# Patient Record
Sex: Male | Born: 1975 | Race: White | Hispanic: No | Marital: Married | Smoking: Former smoker
Health system: Southern US, Community
[De-identification: ages and names within clinical notes are randomized; demographics above are authoritative.]

## PROBLEM LIST (undated history)

## (undated) DIAGNOSIS — D496 Neoplasm of unspecified behavior of brain: Secondary | ICD-10-CM

## (undated) DIAGNOSIS — M539 Dorsopathy, unspecified: Secondary | ICD-10-CM

## (undated) HISTORY — PX: SPINE SURGERY: SHX786

## (undated) HISTORY — PX: BRAIN SURGERY: SHX531

---

## 2014-02-23 ENCOUNTER — Other Ambulatory Visit (HOSPITAL_COMMUNITY): Payer: Self-pay | Admitting: Oncology

## 2014-02-23 DIAGNOSIS — D496 Neoplasm of unspecified behavior of brain: Secondary | ICD-10-CM

## 2014-02-23 DIAGNOSIS — C799 Secondary malignant neoplasm of unspecified site: Secondary | ICD-10-CM

## 2014-03-17 ENCOUNTER — Ambulatory Visit (HOSPITAL_COMMUNITY)
Admission: RE | Admit: 2014-03-17 | Discharge: 2014-03-17 | Disposition: A | Discharge status: Home / Self Care | Payer: BC Managed Care – PPO | Source: Ambulatory Visit | Attending: Oncology | Admitting: Oncology

## 2014-03-17 ENCOUNTER — Other Ambulatory Visit (HOSPITAL_COMMUNITY): Payer: Self-pay | Admitting: Oncology

## 2014-03-17 DIAGNOSIS — Z982 Presence of cerebrospinal fluid drainage device: Secondary | ICD-10-CM

## 2014-03-17 DIAGNOSIS — C799 Secondary malignant neoplasm of unspecified site: Secondary | ICD-10-CM

## 2014-03-17 DIAGNOSIS — C719 Malignant neoplasm of brain, unspecified: Secondary | ICD-10-CM | POA: Insufficient documentation

## 2014-03-17 DIAGNOSIS — R930 Abnormal findings on diagnostic imaging of skull and head, not elsewhere classified: Secondary | ICD-10-CM | POA: Insufficient documentation

## 2014-03-17 DIAGNOSIS — G9389 Other specified disorders of brain: Secondary | ICD-10-CM | POA: Insufficient documentation

## 2014-03-17 DIAGNOSIS — D496 Neoplasm of unspecified behavior of brain: Secondary | ICD-10-CM

## 2014-03-17 DIAGNOSIS — Z981 Arthrodesis status: Secondary | ICD-10-CM | POA: Insufficient documentation

## 2014-03-17 MED ORDER — GADOBENATE DIMEGLUMINE 529 MG/ML IV SOLN
15.0000 mL | Freq: Once | INTRAVENOUS | Status: AC | PRN
  Administered 2014-03-17: 15 mL via INTRAVENOUS

## 2014-12-03 ENCOUNTER — Encounter: Payer: Self-pay | Admitting: Physical Therapy

## 2014-12-03 ENCOUNTER — Ambulatory Visit: Payer: Medicare Other | Attending: Oncology | Admitting: Physical Therapy

## 2014-12-03 DIAGNOSIS — R269 Unspecified abnormalities of gait and mobility: Secondary | ICD-10-CM | POA: Insufficient documentation

## 2014-12-03 DIAGNOSIS — R279 Unspecified lack of coordination: Secondary | ICD-10-CM | POA: Diagnosis not present
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
## 2014-12-03 NOTE — Patient Instructions (Signed)
Instructed in forward, back and side kicks and marching in place for balance HEP

## 2014-12-14 ENCOUNTER — Encounter: Payer: Self-pay | Admitting: Physical Therapy

## 2014-12-14 ENCOUNTER — Ambulatory Visit: Payer: Medicare Other | Attending: Oncology | Admitting: Physical Therapy

## 2014-12-14 DIAGNOSIS — R279 Unspecified lack of coordination: Secondary | ICD-10-CM | POA: Insufficient documentation

## 2014-12-14 DIAGNOSIS — R269 Unspecified abnormalities of gait and mobility: Secondary | ICD-10-CM | POA: Diagnosis not present

## 2014-12-14 NOTE — Therapy (Signed)
[facility] [address] [place], [place], [address] Phone: [phone]   Fax:  [phone]  Physical Therapy Treatment  Patient Details  Name: Derek Day MRN: [id number] Date of Birth: Mar 21, 1976 Referring Provider:  Ardelle Anton, MD  Encounter Date: 12/14/2014      PT End of Session - 12/14/14 1305    Visit Number 2  G2   Number of Visits 17   Date for PT Re-Evaluation 01/02/15   Authorization Type BCBS   Authorization - Visit Number 30   PT Start Time 0930   PT Stop Time 1025   PT Time Calculation (min) 55 min      History reviewed. No pertinent past medical history.  History reviewed. No pertinent past surgical history.  There were no vitals taken for this visit.  Visit Diagnosis:  Abnormality of gait  Lack of coordination      Subjective Assessment - 12/14/14 1229    Symptoms Pt. states he tried the balance exercises given to him last time (at eval) but he has to hold onto the counter and has trouble doing the side kicks   Patient Stated Goals improve strength and balance   Currently in Pain? Yes   Pain Score 2    Pain Location Back   Pain Orientation Mid   Pain Descriptors / Indicators Tightness   Pain Type Chronic pain   Pain Onset More than a month ago   Pain Frequency Constant                    [facility] Adult PT Treatment/Exercise - 12/14/14 1232    Transfers   Transfers Sit to Stand  10 reps with use of RW to assist with balance upon standing   Lumbar Exercises: Standing   Heel Raises 10 reps   Knee/Hip Exercises: Supine   Bridges 10 reps  L 1/2 bridge 10 reps   Straight Leg Raises 10 reps  RLE and LLE   Knee/Hip Exercises: Sidelying   Hip ABduction 10 reps   Clams 10 reps   Knee/Hip Exercises: Prone   Hamstring Curl 10 reps  RLE and LLE; green band used for resistance for LLE     TherEx:  Heel slides RLE and LLE with extension x 10 reps each leg;  Scifit level 1.5  X 10" (no charge as unsupervised) R and L hip abduction with green theraband x 10 reps each in hooklying position  Neuro Re-ed: standing forward kicks and stepping up/back; lateral kicks and stepping out/in x 10 reps each with UE support  Prn:  Marching in place with UE support         PT Education - 12/14/14 1302    Education provided Yes   Education Details added strengthening exercises to HEP and sit to stand   Methods Handout;Explanation;Demonstration   Comprehension Verbalized understanding          PT Short Term Goals - 12/03/14 2019    PT SHORT TERM GOAL #1   Title Incr. Berg balance test score to >/=31/56 to decr. fall risk   Baseline score 26/56   target date 01-02-15   Time 4   Period Weeks   Status New   PT SHORT TERM GOAL #2   Title Improve TUG score to </= 24 secs with RW with SBA   Baseline 28.72 with RW   Target date 01-02-15   Time 4   Period Weeks   Status New  PT SHORT TERM GOAL #3   Title Increase gait velocity to >/= 2.9 ft/sec with RW with SBA   Baseline 2.59 ft/sec with RW     target date 01-02-15   Time 4   Period Weeks   Status New   PT SHORT TERM GOAL #4   Title Independent in HEP for balance and strengthening exercises   Baseline date 01-02-15   Time 4   Period Weeks   Status New           PT Long Term Goals - 12/03/14 2023    PT LONG TERM GOAL #1   Title Increase Berg balance test score to >/= 36/56 to decr. fall risk   Baseline 02-01-15     26/56 initial score on 12-03-14   Time 8   Period Weeks   Status New   PT LONG TERM GOAL #2   Title Improve TUG score to </= 19 secs with RW   Baseline  target date  02-01-15               28.72 secs with RW on 12-03-14   Time 8   Period Weeks   Status New   PT LONG TERM GOAL #3   Title Incr. gait velocity to >/= 2.2 ft/sec with RW with SBA   Baseline target date 02-01-15   2.59 ft/sec on 12-03-14   Time 8   Period Weeks   Status New   PT LONG TERM GOAL #4   Title Report  at least 50% improvement in balance and strength in bil. LE's   Baseline Target date 02-01-15   Time 8   Period Weeks   Status New               Plan - 12/14/14 1307    Clinical Impression Statement Pt. 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 requires frequent rest breaks   Pt will benefit from skilled therapeutic intervention in order to improve on the following deficits Abnormal gait;Decreased coordination;Difficulty walking;Decreased endurance;Decreased balance;Decreased mobility;Decreased strength;Pain;Impaired vision/preception   Rehab Potential Fair   PT Frequency 2x / week   PT Duration 8 weeks   PT Treatment/Interventions Therapeutic activities;Patient/family education;Therapeutic exercise;Gait training;Balance training;Stair training;Neuromuscular re-education;Functional mobility training   PT Next Visit Plan check balance HEP initiated today; cont. LLE strengthening and gait training/core stabilization   PT Home Exercise Plan balance and LLE strengthening   Consulted and Agree with Plan of Care Patient        Problem List There are no active problems to display for this patient.   KXFGHW, EXHBZ JIRCVEL, PT 12/14/2014, 1:13 PM  [facility] [address] [place], [place], [address] Phone: [phone]   Fax:  [phone]

## 2014-12-14 NOTE — Patient Instructions (Signed)
Strengthening: Straight Leg Raise (Phase 2)   Resting on forearms, tighten muscles on front of left thigh, then lift leg ____ inches from surface, keeping knee locked. Repeat ____ times per set. Do ____ sets per session. Do ____ sessions per day.  http://orth.exer.us/616   Copyright  VHI. All rights reserved.  Abduction / Adduction   Feet hip width apart, spread thighs out, then bring thighs together. Repeat ___ times each direction. Do ___ sessions per day. Do with ______ colored band around thighs. Note: If possible, place feet on floor.  Copyright  VHI. All rights reserved.  Knee Flexion: Resisted (Standing)   With support, ____ pound weight around right ankle, slowly bend knee up. Return slowly.  Repeat ____ times per set. Do ____ sets per session. Do ____ sessions per day.  http://orth.exer.us/740   Copyright  VHI. All rights reserved.  Abduction   Lift leg up toward ceiling. Return. Use ____ lbs on ankle. Repeat ____ times each leg. Do ____ sessions per day.  http://gt2.exer.us/385   Copyright  VHI. All rights reserved.  Abduction   Lift leg up toward ceiling. Return. Use ____ lbs on ankle. Repeat ____ times each leg. Do ____ sessions per day.  http://gt2.exer.us/385   Copyright  VHI. All rights reserved.  Bridging   Slowly raise buttocks from floor, keeping stomach tight. Repeat ____ times per set. Do ____ sets per session. Do ____ sessions per day.  http://orth.exer.us/1096   Copyright  VHI. All rights reserved.  Bridging   Slowly raise buttocks from floor, keeping stomach tight. Repeat ____ times per set. Do ____ sets per session. Do ____ sessions per day.  http://orth.exer.us/1096   Copyright  VHI. All rights reserved.  Functional Quadriceps: Sit to Stand   Sit on edge of chair, feet flat on floor. Stand upright, extending knees fully. Repeat ____ times per set. Do ____ sets per session. Do ____ sessions per  day.  http://orth.exer.us/734   Copyright  VHI. All rights reserved.  Functional Quadriceps: Sit to Stand   Sit on edge of chair, feet flat on floor. Stand upright, extending knees fully. Repeat ____ times per set. Do ____ sets per session. Do ____ sessions per day.  http://orth.exer.us/734   Copyright  VHI. All rights reserved.

## 2014-12-17 ENCOUNTER — Encounter: Payer: Self-pay | Admitting: Physical Therapy

## 2014-12-17 ENCOUNTER — Ambulatory Visit: Payer: Medicare Other | Admitting: Physical Therapy

## 2014-12-17 DIAGNOSIS — R269 Unspecified abnormalities of gait and mobility: Secondary | ICD-10-CM | POA: Diagnosis not present

## 2014-12-17 DIAGNOSIS — R279 Unspecified lack of coordination: Secondary | ICD-10-CM

## 2014-12-17 NOTE — Therapy (Signed)
[facility] [address] [place], [place], [address] Phone: [phone]   Fax:  [phone]  Physical Therapy Treatment  Patient Details  Name: Derek Day MRN: [id number] Date of Birth: Dec 20, 1975 Referring Provider:  Ardelle Anton, MD  Encounter Date: 12/17/2014      PT End of Session - 12/17/14 1237    Visit Number 3   Number of Visits 17   Date for PT Re-Evaluation 01/02/15   Authorization Type BCBS   Authorization - Visit Number 3   Authorization - Number of Visits 30   PT Start Time 0830   PT Stop Time 0925   PT Time Calculation (min) 55 min   Equipment Utilized During Treatment Gait belt      History reviewed. No pertinent past medical history.  History reviewed. No pertinent past surgical history.  There were no vitals taken for this visit.  Visit Diagnosis:  Abnormality of gait  Lack of coordination      Subjective Assessment - 12/17/14 1228    Symptoms Pt. states he was really tired after therapy on Monday; had to sit and wait a little while before driving home   Patient Stated Goals improve strength and balance   Currently in Pain? No/denies                    [facility] Adult PT Treatment/Exercise - 12/17/14 1235    Transfers   Transfers Sit to Stand  10 reps with use of RW to assist with balance upon standing   Knee/Hip Exercises: Aerobic   Stationary Bike Scifit level 1.6 x 5"   Knee/Hip Exercises: Standing   Heel Raises 10 reps      TherEx:  Bridging with ball between knees x 10 reps; 1/2 bridging R and LLE x 10 reps; hip abduction with green theraband x 10 reps in hooklying position:  Hip abduction with external rotation x 10 reps each leg; bil. Knee to chest With extension with CGA for control;  SLR x 10 reps each; R and LLE hip extension control off side of mat x 10 reps each with Min assist for control;  In hooklying - hip flexion with green band for resistance  x 10 reps each  NeuroRe-ed:  Sit to stand without UE support x 10 reps with CGA to min assist; standing unsupported with head turns With min assist - pt. c/o some vertigo with head turns Quadriped - lifting RUE and LUE , then RLE and LLE with min to mod assist to maintain balance in quadriped position            PT Short Term Goals - 12/03/14 2019    PT SHORT TERM GOAL #1   Title Incr. Berg balance test score to >/=31/56 to decr. fall risk   Baseline score 26/56   target date 01-02-15   Time 4   Period Weeks   Status New   PT SHORT TERM GOAL #2   Title Improve TUG score to </= 24 secs with RW with SBA   Baseline 28.72 with RW   Target date 01-02-15   Time 4   Period Weeks   Status New   PT SHORT TERM GOAL #3   Title Increase gait velocity to >/= 2.9 ft/sec with RW with SBA   Baseline 2.59 ft/sec with RW     target date 01-02-15   Time 4   Period Weeks   Status New   PT SHORT  TERM GOAL #4   Title Independent in HEP for balance and strengthening exercises   Baseline date 01-02-15   Time 4   Period Weeks   Status New           PT Long Term Goals - 12/03/14 2023    PT LONG TERM GOAL #1   Title Increase Berg balance test score to >/= 36/56 to decr. fall risk   Baseline 02-01-15     26/56 initial score on 12-03-14   Time 8   Period Weeks   Status New   PT LONG TERM GOAL #2   Title Improve TUG score to </= 19 secs with RW   Baseline  target date  02-01-15               28.72 secs with RW on 12-03-14   Time 8   Period Weeks   Status New   PT LONG TERM GOAL #3   Title Incr. gait velocity to >/= 2.2 ft/sec with RW with SBA   Baseline target date 02-01-15   2.59 ft/sec on 12-03-14   Time 8   Period Weeks   Status New   PT LONG TERM GOAL #4   Title Report at least 50% improvement in balance and strength in bil. LE's   Baseline Target date 02-01-15   Time 8   Period Weeks   Status New               Plan - 12/17/14 1238    Clinical Impression Statement Pt. has  significantly decreased core stabilization and decr. trunk musc. strength - needs min to mod assist to stabilize trunk during LE exercises   Pt will benefit from skilled therapeutic intervention in order to improve on the following deficits Abnormal gait;Decreased coordination;Difficulty walking;Decreased endurance;Decreased balance;Decreased mobility;Decreased strength;Pain;Impaired vision/preception   Rehab Potential Good   PT Frequency 2x / week   PT Duration 8 weeks   PT Treatment/Interventions Therapeutic activities;Patient/family education;Therapeutic exercise;Gait training;Balance training;Stair training;Neuromuscular re-education;Functional mobility training   PT Next Visit Plan continue LE strengthening and balance exercises   PT Home Exercise Plan balance and LLE strengthening - cont with quadriped   Consulted and Agree with Plan of Care Patient        Problem List There are no active problems to display for this patient.   Alda Lea, PT  12/17/2014, 12:43 PM  [facility] [address] [place], [place], [address] Phone: [phone]   Fax:  [phone]

## 2014-12-21 ENCOUNTER — Ambulatory Visit: Payer: BC Managed Care – PPO | Admitting: Physical Therapy

## 2014-12-22 ENCOUNTER — Encounter: Payer: Self-pay | Admitting: Physical Therapy

## 2014-12-22 ENCOUNTER — Ambulatory Visit: Payer: Medicare Other | Admitting: Physical Therapy

## 2014-12-22 DIAGNOSIS — R269 Unspecified abnormalities of gait and mobility: Secondary | ICD-10-CM

## 2014-12-22 DIAGNOSIS — R279 Unspecified lack of coordination: Secondary | ICD-10-CM

## 2014-12-22 NOTE — Therapy (Signed)
[facility] [address] [place], [place], [address] Phone: [phone]   Fax:  [phone]  Physical Therapy Treatment  Patient Details  Name: Derek Day MRN: [id number] Date of Birth: 1975-12-18 Referring Provider:  Ardelle Anton, MD  Encounter Date: 12/22/2014      PT End of Session - 12/22/14 1314    Visit Number 4  G4   Number of Visits 17   Date for PT Re-Evaluation 01/02/15   Authorization Type [employer] Medicare   Authorization - Visit Number 4   PT Start Time 0845   PT Stop Time 0932   PT Time Calculation (min) 47 min      History reviewed. No pertinent past medical history.  History reviewed. No pertinent past surgical history.  There were no vitals taken for this visit.  Visit Diagnosis:  Abnormality of gait  Lack of coordination      Subjective Assessment - 12/22/14 1306    Symptoms Pt. states he feels that legs are getting stronger - feels he has more controlled movement with them   Patient Stated Goals improve strength and balance   Currently in Pain? No/denies                    [facility] Adult PT Treatment/Exercise - 12/22/14 1308    Ambulation/Gait   Ambulation/Gait Yes   Ambulation Distance (Feet) 40 Feet   Assistive device Parallel bars  no UE support used   Gait Pattern Ataxic   Ambulation Surface Level   Lumbar Exercises: Supine   Clam 10 reps  bil. LE's; 3# weight used on LLE; no weight on RLE   Bent Knee Raise 10 reps  bil. LE's   Bridge 10 reps  ball between knees   Straight Leg Raise 10 reps   Other Supine Lumbar Exercises 1/2 bridge R and LLE x 10 reps   Knee/Hip Exercises: Aerobic   Stationary Bike Scifit level 1.6 x 5"   Knee/Hip Exercises: Prone   Hamstring Curl 10 reps  RLE and LLE; green band used for resistance for LLE    Prone bil. Knee flexion x 10 reps each with no weight - cues for eccentric control   Neuro re-ed: trunk and core  stabilization in tall kneeling with head turns with min to mod assist; partial squats in tall  Kneeling x 10 reps; alternate stepping up and back inside parallel bars with UE support prn x 10 reps each; standing Unsupported in bars with head turns; lifting alternate UE's up and down for core stabilization marching in place with UE support prn             PT Short Term Goals - 12/03/14 2019    PT SHORT TERM GOAL #1   Title Incr. Berg balance test score to >/=31/56 to decr. fall risk   Baseline score 26/56   target date 01-02-15   Time 4   Period Weeks   Status New   PT SHORT TERM GOAL #2   Title Improve TUG score to </= 24 secs with RW with SBA   Baseline 28.72 with RW   Target date 01-02-15   Time 4   Period Weeks   Status New   PT SHORT TERM GOAL #3   Title Increase gait velocity to >/= 2.9 ft/sec with RW with SBA   Baseline 2.59 ft/sec with RW     target date 01-02-15   Time 4   Period Weeks  Status New   PT SHORT TERM GOAL #4   Title Independent in HEP for balance and strengthening exercises   Baseline date 01-02-15   Time 4   Period Weeks   Status New           PT Long Term Goals - 12/03/14 2023    PT LONG TERM GOAL #1   Title Increase Berg balance test score to >/= 36/56 to decr. fall risk   Baseline 02-01-15     26/56 initial score on 12-03-14   Time 8   Period Weeks   Status New   PT LONG TERM GOAL #2   Title Improve TUG score to </= 19 secs with RW   Baseline  target date  02-01-15               28.72 secs with RW on 12-03-14   Time 8   Period Weeks   Status New   PT LONG TERM GOAL #3   Title Incr. gait velocity to >/= 2.2 ft/sec with RW with SBA   Baseline target date 02-01-15   2.59 ft/sec on 12-03-14   Time 8   Period Weeks   Status New   PT LONG TERM GOAL #4   Title Report at least 50% improvement in balance and strength in bil. LE's   Baseline Target date 02-01-15   Time 8   Period Weeks   Status New               Plan - 12/22/14 1646     Pt will benefit from skilled therapeutic intervention in order to improve on the following deficits Abnormal gait;Decreased coordination;Difficulty walking;Decreased endurance;Decreased balance;Decreased mobility;Decreased strength;Pain;Impaired vision/preception   Rehab Potential Good   PT Frequency 2x / week   PT Duration 8 weeks   PT Treatment/Interventions Therapeutic activities;Patient/family education;Therapeutic exercise;Gait training;Balance training;Stair training;Neuromuscular re-education;Functional mobility training   PT Next Visit Plan continue LE strengthening and balance exercises        Problem List There are no active problems to display for this patient.   Alda Lea, PT 12/22/2014, 4:51 PM  [facility] [address] [place], [place], [address] Phone: [phone]   Fax:  [phone]

## 2014-12-24 ENCOUNTER — Encounter: Payer: Self-pay | Admitting: Physical Therapy

## 2014-12-24 ENCOUNTER — Ambulatory Visit: Payer: Medicare Other | Admitting: Physical Therapy

## 2014-12-24 DIAGNOSIS — R269 Unspecified abnormalities of gait and mobility: Secondary | ICD-10-CM

## 2014-12-24 NOTE — Therapy (Signed)
[facility] [address] [place], [place], [address] Phone: [phone]   Fax:  [phone]  Physical Therapy Treatment  Patient Details  Name: Derek Day MRN: [id number] Date of Birth: [birth year] Referring Provider:  Ardelle Anton, MD  Encounter Date: 12/24/2014      PT End of Session - 12/24/14 1640    Visit Number 5  G5   Number of Visits 17   Date for PT Re-Evaluation 01/02/15   Authorization Type [employer] Medicare   PT Start Time 0850   PT Stop Time 0935   PT Time Calculation (min) 45 min      History reviewed. No pertinent past medical history.  History reviewed. No pertinent past surgical history.  There were no vitals taken for this visit.  Visit Diagnosis:  Abnormality of gait      Subjective Assessment - 12/24/14 1629    Symptoms Pt. reports no changes since last session - denies falls   Patient Stated Goals improve strength and balance   Currently in Pain? No/denies                    [facility] Adult PT Treatment/Exercise - 12/24/14 1630    Transfers   Transfers Sit to Stand  10 reps with use of RW to assist with balance upon standing   Ambulation/Gait   Ambulation/Gait Yes   Ambulation Distance (Feet) 40 Feet   Assistive device Parallel bars  no UE support used   Gait Pattern Ataxic   Ambulation Surface Level   Lumbar Exercises: Aerobic   Stationary Bike SciFit level 1.5 x 5" with UE's and LE's   Lumbar Exercises: Supine   Clam 10 reps  bil. LE's; 3# weight used on LLE; no weight on RLE   Straight Leg Raise 10 reps   Knee/Hip Exercises: Standing   Heel Raises 10 reps   Knee/Hip Exercises: Sidelying   Hip ABduction 10 reps   Knee/Hip Exercises: Prone   Hamstring Curl 10 reps  RLE and LLE; green band used for resistance for LLE   Ankle Exercises: Standing   Toe Raise 10 reps   Balance Exercises: Standing   Balance Beam standing on blue balance foam perpendicular  without UE support     TherEx:  Leg press - seat at 15 - 40# bil. LE's 3 sets of 10 reps Bridging x 10 reps; bridging with hip abdct/adduction x 5 reps;  Prone bil. Knee flexion x 10 reps with min assist for eccentric control R hip extension control exercise - lifting off/on mat x 10 reps RLE NeuroRe-ed:  Sitting on sitfit for trunk/core stabilization - extending LE's for 5 sec hold; lifting UE with LE extension x 3 reps each Weight shifts anterior/posterior and laterally x 10 reps each direction; stepping over and back of balance beam x 5 reps each LE With UE support prn with CGA Alternate stepping up/back x 10 reps without UE support inside bars            PT Short Term Goals - 12/03/14 2019    PT SHORT TERM GOAL #1   Title Incr. Berg balance test score to >/=31/56 to decr. fall risk   Baseline score 26/56   target date 01-02-15   Time 4   Period Weeks   Status New   PT SHORT TERM GOAL #2   Title Improve TUG score to </= 24 secs with RW with SBA   Baseline 28.72 with RW  Target date 01-02-15   Time 4   Period Weeks   Status New   PT SHORT TERM GOAL #3   Title Increase gait velocity to >/= 2.9 ft/sec with RW with SBA   Baseline 2.59 ft/sec with RW     target date 01-02-15   Time 4   Period Weeks   Status New   PT SHORT TERM GOAL #4   Title Independent in HEP for balance and strengthening exercises   Baseline date 01-02-15   Time 4   Period Weeks   Status New           PT Long Term Goals - 12/03/14 2023    PT LONG TERM GOAL #1   Title Increase Berg balance test score to >/= 36/56 to decr. fall risk   Baseline 02-01-15     26/56 initial score on 12-03-14   Time 8   Period Weeks   Status New   PT LONG TERM GOAL #2   Title Improve TUG score to </= 19 secs with RW   Baseline  target date  02-01-15               28.72 secs with RW on 12-03-14   Time 8   Period Weeks   Status New   PT LONG TERM GOAL #3   Title Incr. gait velocity to >/= 2.2 ft/sec with RW with SBA    Baseline target date 02-01-15   2.59 ft/sec on 12-03-14   Time 8   Period Weeks   Status New   PT LONG TERM GOAL #4   Title Report at least 50% improvement in balance and strength in bil. LE's   Baseline Target date 02-01-15   Time 8   Period Weeks   Status New               Plan - 12/24/14 1642    Clinical Impression Statement Pt. progressing toward LTG's - slowly improving balance; gait continues to be ataxic with unsteadiness noted with turns   Pt will benefit from skilled therapeutic intervention in order to improve on the following deficits Abnormal gait;Decreased coordination;Difficulty walking;Decreased endurance;Decreased balance;Decreased mobility;Decreased strength;Pain;Impaired vision/preception   Rehab Potential Good   PT Frequency 2x / week   PT Duration 8 weeks   PT Treatment/Interventions Therapeutic activities;Patient/family education;Therapeutic exercise;Gait training;Balance training;Stair training;Neuromuscular re-education;Functional mobility training   PT Next Visit Plan continue LE strengthening and balance exercises   Consulted and Agree with Plan of Care Patient        Problem List There are no active problems to display for this patient.   Alda Lea, PT 12/24/2014, 4:47 PM  [facility] [address] [place], [place], [address] Phone: [phone]   Fax:  [phone]

## 2014-12-28 ENCOUNTER — Ambulatory Visit: Payer: Medicare Other | Admitting: Physical Therapy

## 2014-12-31 ENCOUNTER — Ambulatory Visit: Payer: Medicare Other | Admitting: Physical Therapy

## 2014-12-31 ENCOUNTER — Encounter: Payer: Self-pay | Admitting: Physical Therapy

## 2014-12-31 ENCOUNTER — Ambulatory Visit: Payer: BC Managed Care – PPO | Admitting: Physical Therapy

## 2014-12-31 DIAGNOSIS — R279 Unspecified lack of coordination: Secondary | ICD-10-CM

## 2014-12-31 DIAGNOSIS — R269 Unspecified abnormalities of gait and mobility: Secondary | ICD-10-CM

## 2014-12-31 NOTE — Therapy (Signed)
[facility] [address] [place], [place], [address] Phone: [phone]   Fax:  [phone]  Physical Therapy Treatment  Patient Details  Name: Derek Day MRN: [id number] Date of Birth: 11-15-1975 Referring Provider:  Ardelle Anton, MD  Encounter Date: 12/31/2014      PT End of Session - 12/31/14 1235    Visit Number 6  G6   Number of Visits 17   Date for PT Re-Evaluation 01/02/15   Authorization Type [employer] Medicare   PT Start Time 0850   PT Stop Time 0932   PT Time Calculation (min) 42 min      History reviewed. No pertinent past medical history.  History reviewed. No pertinent past surgical history.  There were no vitals taken for this visit.  Visit Diagnosis:  Abnormality of gait  Lack of coordination      Subjective Assessment - 12/31/14 1230    Symptoms Pt. states that he feels that he has "lost some ground' since we had the snow day; hasn't been able to do HEP consistently   Patient Stated Goals improve strength and balance   Currently in Pain? No/denies                    [facility] Adult PT Treatment/Exercise - 12/31/14 0001    Ambulation/Gait   Ambulation/Gait Yes   Ambulation Distance (Feet) 60 Feet   Assistive device Parallel bars  UE support prn   Gait Pattern Ataxic   Ambulation Surface Level   Lumbar Exercises: Aerobic   Stationary Bike SciFit level 1.5 x 5" with UE's and LE's   Lumbar Exercises: Standing   Heel Raises 10 reps   Lumbar Exercises: Supine   Bent Knee Raise 10 reps  bil. LE's   Bridge 10 reps  ball between knees   Straight Leg Raise 10 reps  bil. LE's   Other Supine Lumbar Exercises 1/2 bridge R and LLE x 10 reps   Knee/Hip Exercises: Standing   Heel Raises 10 reps   Knee/Hip Exercises: Prone   Hamstring Curl 10 reps  RLE and LLE; green band used for resistance for LLE     NeuroRe-ed:  Tall kneeling with horizontal head turns with UE  support on swiss ball; lifting opposite UE in tall kneeling Standing in parallel bars - sidestepping, marching in place x 10 reps each; sidestepping inside bars with UE suppport prn Alternate tap ups to 4" with UE support prn with CGA  TherEx:  Prone knee flexion x 10 reps each; hip extension with flexed knee x 10 reps each; hip extension control off side of  Mat bil. LE's 10 reps each           PT Short Term Goals - 12/03/14 2019    PT SHORT TERM GOAL #1   Title Incr. Berg balance test score to >/=31/56 to decr. fall risk   Baseline score 26/56   target date 01-02-15   Time 4   Period Weeks   Status New   PT SHORT TERM GOAL #2   Title Improve TUG score to </= 24 secs with RW with SBA   Baseline 28.72 with RW   Target date 01-02-15   Time 4   Period Weeks   Status New   PT SHORT TERM GOAL #3   Title Increase gait velocity to >/= 2.9 ft/sec with RW with SBA   Baseline 2.59 ft/sec with RW     target date 01-02-15  Time 4   Period Weeks   Status New   PT SHORT TERM GOAL #4   Title Independent in HEP for balance and strengthening exercises   Baseline date 01-02-15   Time 4   Period Weeks   Status New           PT Long Term Goals - 12/03/14 2023    PT LONG TERM GOAL #1   Title Increase Berg balance test score to >/= 36/56 to decr. fall risk   Baseline 02-01-15     26/56 initial score on 12-03-14   Time 8   Period Weeks   Status New   PT LONG TERM GOAL #2   Title Improve TUG score to </= 19 secs with RW   Baseline  target date  02-01-15               28.72 secs with RW on 12-03-14   Time 8   Period Weeks   Status New   PT LONG TERM GOAL #3   Title Incr. gait velocity to >/= 2.2 ft/sec with RW with SBA   Baseline target date 02-01-15   2.59 ft/sec on 12-03-14   Time 8   Period Weeks   Status New   PT LONG TERM GOAL #4   Title Report at least 50% improvement in balance and strength in bil. LE's   Baseline Target date 02-01-15   Time 8   Period Weeks   Status New                Plan - 12/31/14 1237    Clinical Impression Statement Pt. has decr. trunk/core stabilization; has difficulty maintaining upright without UE support; cont. to have ataxic gait pattern   Pt will benefit from skilled therapeutic intervention in order to improve on the following deficits Abnormal gait;Decreased coordination;Difficulty walking;Decreased endurance;Decreased balance;Decreased mobility;Decreased strength;Pain;Impaired vision/preception   Rehab Potential Good   PT Frequency 2x / week   PT Duration 8 weeks   PT Treatment/Interventions Therapeutic activities;Patient/family education;Therapeutic exercise;Gait training;Balance training;Stair training;Neuromuscular re-education;Functional mobility training   PT Next Visit Plan continue LE strengthening and balance exercises; check STG's   PT Home Exercise Plan balance and LLE strengthening -    Consulted and Agree with Plan of Care Patient        Problem List There are no active problems to display for this patient.   Alda Lea, PT 12/31/2014, 12:40 PM  [facility] [address] [place], [place], [address] Phone: [phone]   Fax:  [phone]

## 2015-01-04 ENCOUNTER — Ambulatory Visit: Payer: BC Managed Care – PPO | Admitting: Physical Therapy

## 2015-01-05 ENCOUNTER — Ambulatory Visit: Payer: Medicare Other | Admitting: Physical Therapy

## 2015-01-07 ENCOUNTER — Encounter: Payer: Self-pay | Admitting: Physical Therapy

## 2015-01-07 ENCOUNTER — Ambulatory Visit: Payer: Medicare Other | Admitting: Physical Therapy

## 2015-01-07 DIAGNOSIS — R279 Unspecified lack of coordination: Secondary | ICD-10-CM

## 2015-01-07 DIAGNOSIS — R269 Unspecified abnormalities of gait and mobility: Secondary | ICD-10-CM | POA: Diagnosis not present

## 2015-01-07 NOTE — Therapy (Signed)
[facility] [address] [place], [place], [address] Phone: [phone]   Fax:  [phone]  Physical Therapy Treatment  Patient Details  Name: Derek Day MRN: [id number] Date of Birth: 08-Jul-1976 Referring Provider:  Ardelle Anton, MD  Encounter Date: 01/07/2015      PT End of Session - 01/07/15 1252    Visit Number 7   Number of Visits 17   Date for PT Re-Evaluation 01/02/15   Authorization Type [employer] Medicare   PT Start Time 0850   PT Stop Time 0934   PT Time Calculation (min) 44 min   Equipment Utilized During Treatment Gait belt   Activity Tolerance Patient tolerated treatment well   Behavior During Therapy [facility] for tasks assessed/performed      History reviewed. No pertinent past medical history.  History reviewed. No pertinent past surgical history.  There were no vitals taken for this visit.  Visit Diagnosis:  Abnormality of gait  Lack of coordination      Subjective Assessment - 01/07/15 1234    Symptoms denies falls or changes since last visit   Currently in Pain? No/denies                    [facility] Adult PT Treatment/Exercise - 01/07/15 1239    Ambulation/Gait   Ambulation/Gait Yes   Ambulation/Gait Assistance 5: Supervision   Ambulation Distance (Feet) 120 Feet  three times   Assistive device Rolling walker   Gait Pattern Ataxic   Gait velocity 2.83   Berg Balance Test   Sit to Stand Able to stand without using hands and stabilize independently   Standing Unsupported Able to stand 2 minutes with supervision   Sitting with Back Unsupported but Feet Supported on Floor or Stool Able to sit safely and securely 2 minutes   Stand to Sit Sits safely with minimal use of hands   Transfers Able to transfer safely, definite need of hands   Standing Unsupported with Eyes Closed Needs help to keep from falling   Standing Ubsupported with Feet Together Needs help to attain  position and unable to hold for 15 seconds   From Standing, Reach Forward with Outstretched Arm Reaches forward but needs supervision   From Standing Position, Pick up Object from Floor Able to pick up shoe, needs supervision   From Standing Position, Turn to Look Behind Over each Shoulder Needs assist to keep from losing balance and falling   Turn 360 Degrees Needs close supervision or verbal cueing   Standing Unsupported, Alternately Place Feet on Step/Stool Able to complete >2 steps/needs minimal assist   Standing Unsupported, One Foot in Front Needs help to step but can hold 15 seconds   Standing on One Leg Tries to lift leg/unable to hold 3 seconds but remains standing independently   Total Score 26   Timed Up and Go Test   TUG Normal TUG   Normal TUG (seconds) 22.28  with RW   Lumbar Exercises: Supine   Clam 10 reps   Bent Knee Raise 10 reps   Bridge 10 reps   Straight Leg Raise 10 reps   Lumbar Exercises: Sidelying   Clam 10 reps                  PT Short Term Goals - 01/07/15 1253    PT SHORT TERM GOAL #1   Title Incr. Berg balance test score to >/=31/56 to decr. fall risk   Baseline score  26/56   target date 01-02-15   Status Not Met   PT SHORT TERM GOAL #2   Title Improve TUG score to </= 24 secs with RW with SBA   Baseline 28.72 with RW   Target date 01-02-15   Status Achieved   PT SHORT TERM GOAL #3   Title Increase gait velocity to >/= 2.9 ft/sec with RW with SBA   Baseline 2.59 ft/sec with RW     target date 01-02-15   Status Achieved   PT SHORT TERM GOAL #4   Title Independent in HEP for balance and strengthening exercises   Baseline date 01-02-15   Time 4   Period Weeks           PT Long Term Goals - 12/03/14 2023    PT LONG TERM GOAL #1   Title Increase Berg balance test score to >/= 36/56 to decr. fall risk   Baseline 02-01-15     26/56 initial score on 12-03-14   Time 8   Period Weeks   Status New   PT LONG TERM GOAL #2   Title Improve TUG  score to </= 19 secs with RW   Baseline  target date  02-01-15               28.72 secs with RW on 12-03-14   Time 8   Period Weeks   Status New   PT LONG TERM GOAL #3   Title Incr. gait velocity to >/= 2.2 ft/sec with RW with SBA   Baseline target date 02-01-15   2.59 ft/sec on 12-03-14   Time 8   Period Weeks   Status New   PT LONG TERM GOAL #4   Title Report at least 50% improvement in balance and strength in bil. LE's   Baseline Target date 02-01-15   Time 8   Period Weeks   Status New               Plan - 01/07/15 1255    Clinical Impression Statement STG # 1 unmet, STG # 2 & 3 met.  Need to finish checking goals next session.  Pt continues with ataxic gait and decreased balance.   Pt will benefit from skilled therapeutic intervention in order to improve on the following deficits Abnormal gait;Decreased coordination;Difficulty walking;Decreased endurance;Decreased balance;Decreased mobility;Decreased strength;Pain;Impaired vision/preception   Rehab Potential Good   PT Frequency 2x / week   PT Duration 8 weeks   PT Treatment/Interventions Therapeutic activities;Patient/family education;Therapeutic exercise;Gait training;Balance training;Stair training;Neuromuscular re-education;Functional mobility training   PT Next Visit Plan Finish checking standing and seated HEP.        Problem List There are no active problems to display for this patient.   Narda Bonds 01/07/2015, 12:58 PM  [facility] [address] [place], [place], [address] Phone: [phone]   Fax:  [place] 01/07/2015 12:58 PM Phone: [phone] Fax: [phone]

## 2015-01-20 ENCOUNTER — Ambulatory Visit: Payer: Medicare Other | Admitting: Physical Therapy

## 2015-01-21 ENCOUNTER — Ambulatory Visit: Payer: Medicare Other | Admitting: *Deleted

## 2015-01-25 ENCOUNTER — Ambulatory Visit: Payer: Medicare Other | Admitting: *Deleted

## 2015-01-28 ENCOUNTER — Ambulatory Visit: Payer: Medicare Other | Admitting: *Deleted

## 2015-02-01 ENCOUNTER — Ambulatory Visit: Payer: Medicare Other | Admitting: Physical Therapy

## 2015-02-04 ENCOUNTER — Ambulatory Visit: Payer: Medicare Other | Admitting: *Deleted

## 2015-02-08 ENCOUNTER — Ambulatory Visit: Payer: Medicare Other | Admitting: *Deleted

## 2015-02-11 ENCOUNTER — Ambulatory Visit: Payer: Medicare Other | Admitting: *Deleted

## 2015-03-13 ENCOUNTER — Emergency Department
Admission: EM | Admit: 2015-03-13 | Discharge: 2015-03-13 | Disposition: A | Discharge status: Home / Self Care | Payer: Medicare Other | Source: Home / Self Care | Attending: Emergency Medicine | Admitting: Emergency Medicine

## 2015-03-13 DIAGNOSIS — H6692 Otitis media, unspecified, left ear: Secondary | ICD-10-CM | POA: Diagnosis not present

## 2015-03-13 DIAGNOSIS — H6092 Unspecified otitis externa, left ear: Secondary | ICD-10-CM | POA: Diagnosis not present

## 2015-03-13 HISTORY — DX: Neoplasm of unspecified behavior of brain: D49.6

## 2015-03-13 HISTORY — DX: Dorsopathy, unspecified: M53.9

## 2015-03-13 NOTE — ED Provider Notes (Signed)
CSN: [id number]     Arrival date & time 03/13/15  1334 History   First MD Initiated Contact with Patient 03/13/15 1348     Chief Complaint  Patient presents with  . Otalgia    HPI Pt states he was seen by family MD yesterday for ear pain; was given Keflex po ABx and Neosporin ear gtts.--Just started using this morning.  States when he put the ear gtts in left ear this morning he had increase pain. Hx of ear infection and ruptured left eardrum. History of ear tube placed left eardrum in the past.  States he has more difficulty hearing in left ear. Denies other ENT symptoms. No fever or chills.  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 Denies any acute changes in his level of weakness chronically and his ability to walk only with a walker. Here with wife.  Past Medical History  Diagnosis Date  . Spine disorder   . Brain tumor    History reviewed. No pertinent past surgical history. History reviewed. No pertinent family history. History  Substance Use Topics  . Smoking status: Former Smoker -- 0.50 packs/day for 2 years    Types: Cigarettes  . Smokeless tobacco: Not on file  . Alcohol Use: Yes    Review of Systems Remainder of Review of Systems negative for acute change except as noted in the HPI.  Allergies  Review of patient's allergies indicates no known allergies.  Home Medications   Prior to Admission medications   Medication Sig Start Date End Date Taking? Authorizing Provider  cephALEXin (KEFLEX) 500 MG capsule Take 500 mg by mouth 4 (four) times daily.   Yes Historical Provider, MD  cetirizine (ZYRTEC) 10 MG tablet Take 10 mg by mouth daily.   Yes Historical Provider, MD  neomycin-bacitracin-polymyxin (NEOSPORIN) ophthalmic ointment 1 application 4 (four) times daily.   Yes Historical Provider, MD  ondansetron (ZOFRAN-ODT) 4 MG disintegrating tablet Take 4 mg by mouth every 8 (eight)  hours as needed for nausea or vomiting.   Yes Historical Provider, MD  diazepam (VALIUM) 5 MG tablet Take 5 mg by mouth every 6 (six) hours as needed for muscle spasms.    Historical Provider, MD  HYDROmorphone (DILAUDID) 2 MG tablet Take 2 mg by mouth every 4 (four) hours as needed for severe pain.    Historical Provider, MD   BP 114/79 mmHg  Pulse 90  Temp(Src) 97.9 F (36.6 C) (Tympanic)  Ht 6' (1.829 m)  Wt 155 lb (70.308 kg)  BMI 21.02 kg/m2  SpO2 97% Physical Exam  Constitutional: He is oriented to person, place, and time. He appears well-developed and well-nourished. No distress.  Alert, cooperative. He uses a walker to ambulate, as he needs this chronically from prior complicated history and surgeries (unrelated to his left ear problem)  HENT:  Head: Normocephalic and atraumatic.  Right Ear: External ear normal.  Nose: Nose normal.  Mouth/Throat: Oropharynx is clear and moist.  Left ear: Mild tenderness left external ear. No redness or swelling of the tragus. Left external canal: Inflamed, swollen, but patent. Large amount of impacted wax distally against part of the TM. There is a blue ear tube in place in the TM. No bleeding.  Eyes: Conjunctivae and EOM are normal. Pupils are equal, round, and reactive to light. No scleral icterus.  Neck: Normal range of motion. No JVD present.  Cardiovascular: Normal rate.   Pulmonary/Chest: Effort normal.  Abdominal: He exhibits no distension.  Lymphadenopathy:    He has no cervical adenopathy.  Neurological: He is alert and oriented to person, place, and time.  Skin: Skin is warm. No rash noted.  Psychiatric: He has a normal mood and affect.  Nursing note and vitals reviewed.   ED Course  Procedures (including critical care time) Labs Review Labs Reviewed - No data to display  Imaging Review No results found.   MDM   1. Otitis externa, left   2. Infective left otitis media    He has partial cerumen impaction with a left  otitis externa, but the TM is visualized, red with left ear tube in place. I explained that I would not irrigate the left ear here in urgent care because of risks involved with the left ear tube, and that his ENT needs to see him within the next 1-2 days.  Discussion at length. Risks, benefits, alternatives discussed. Decided to continue the cephalexin that was started by his PCP yesterday as well as continuing Neosporin ear drops left ear.--He prefers to hold off on the ear drops for now. I explained he needs to see his ENT ASAP to evaluate and treat further. I explained that ENT may decide to remove wax and debris from left ear with specialized ENT visualization methods and specialized ENT instruments.  ER if any red flags or if symptoms become worse. Precautions discussed. Red flags discussed. Questions invited and answered. Patient voiced understanding and agreement.   Jacqulyn Cane, MD 03/13/15 [phone]

## 2015-03-13 NOTE — ED Notes (Signed)
Pt states he was seen by family MD yesterday for ear pain; was given ABT and ear gtts.  States when he put the ear gtts in left ear this morning he had increase pain.  Hx of ear infection and ruptured left eardrum. States he has difficulty hearing in left ear

## 2016-01-03 ENCOUNTER — Other Ambulatory Visit (HOSPITAL_COMMUNITY): Payer: Self-pay | Admitting: Oncology

## 2016-01-03 DIAGNOSIS — C711 Malignant neoplasm of frontal lobe: Secondary | ICD-10-CM

## 2016-01-14 ENCOUNTER — Ambulatory Visit (HOSPITAL_COMMUNITY): Payer: Medicare Other

## 2016-02-21 ENCOUNTER — Encounter: Payer: Self-pay | Admitting: *Deleted

## 2016-02-21 ENCOUNTER — Emergency Department (INDEPENDENT_AMBULATORY_CARE_PROVIDER_SITE_OTHER)
Admission: EM | Admit: 2016-02-21 | Discharge: 2016-02-21 | Payer: Medicare HMO | Source: Home / Self Care | Attending: Family Medicine | Admitting: Family Medicine

## 2016-02-21 DIAGNOSIS — L03119 Cellulitis of unspecified part of limb: Secondary | ICD-10-CM

## 2016-02-21 NOTE — Discharge Instructions (Signed)

## 2016-02-21 NOTE — ED Notes (Signed)
Called [facility] ED spoke to charge nurse, Shanon Brow, advised him that the pt was in route via private vehicle. Charna Archer, LPN

## 2016-02-21 NOTE — ED Notes (Addendum)
Pt c/o LT elbow swelling, redness, and warm to touch x today. He reports falling 3-4 wks ago with a scrape on that elbow. 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 His last treatment was last week.

## 2016-02-21 NOTE — ED Provider Notes (Signed)
CSN: [id number]     Arrival date & time 02/21/16  1801 History   First MD Initiated Contact with Patient 02/21/16 1839     Chief Complaint  Patient presents with  . 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  He had minimal pain subsequently.  The abrasion appeared to be healing well until yesterday when he note mild swelling and discomfort in his left posterior elbow.  Today he had increased pain, swelling, and redness.  He recalls having chills during the past 3 to 4 days. Last year he developed an MRSA abscess of his left forearm requiring IV antibiotic treatment.  He has a history of brain tumor and multiple neurosurgical surgeries.  He is presently on Avastin IV and Paraplatin injection.  Patient is a 40 y.o. male presenting with arm injury. The history is provided by the patient.  Arm Injury Location:  Elbow Time since incident:  2 weeks Injury: yes   Mechanism of injury: fall   Fall:    Fall occurred: at a service station.   Impact surface:  Concrete   Point of impact: left posterior elbow. Pain details:    Quality:  Aching   Radiates to:  Does not radiate   Severity:  Mild   Onset quality:  Gradual   Duration:  2 weeks   Timing:  Constant   Progression:  Worsening Chronicity:  New Prior injury to area:  No Relieved by:  Nothing Worsened by:  Movement Ineffective treatments: doxycycline, one dose. Associated symptoms: stiffness and swelling   Associated symptoms: no decreased range of motion, no fatigue, no fever, no muscle weakness, no numbness and no tingling   Risk factors comment:  Immunocompromised   Past Medical History  Diagnosis Date  . Spine disorder   . Brain tumor [facility])    History reviewed. No pertinent past surgical history. History reviewed. No pertinent family history. Social History  Substance Use Topics  . Smoking  status: Former Smoker -- 0.50 packs/day for 2 years    Types: Cigarettes  . Smokeless tobacco: None  . Alcohol Use: Yes    Review of Systems  Constitutional: Positive for chills. Negative for fever and fatigue.  Musculoskeletal: Positive for stiffness.  All other systems reviewed and are negative.   Allergies  Review of patient's allergies indicates no known allergies.  Home Medications   Prior to Admission medications   Medication Sig Start Date End Date Taking? Authorizing Provider  Bevacizumab (AVASTIN IV) Inject into the vein.   Yes Historical Provider, MD  CARBOplatin (PARAPLATIN) 150 MG injection Inject into the vein.   Yes Historical Provider, MD  diazepam (VALIUM) 5 MG tablet Take 5 mg by mouth every 6 (six) hours as needed for muscle spasms.   Yes Historical Provider, MD  HYDROmorphone (DILAUDID) 2 MG tablet Take 2 mg by mouth every 4 (four) hours as needed for severe pain.   Yes Historical Provider, MD  cetirizine (ZYRTEC) 10 MG tablet Take 10 mg by mouth daily.    Historical Provider, MD  neomycin-bacitracin-polymyxin (NEOSPORIN) ophthalmic ointment 1 application 4 (four) times daily.    Historical Provider, MD  ondansetron (ZOFRAN-ODT) 4 MG disintegrating tablet Take 4 mg by mouth every 8 (eight) hours as needed for nausea or vomiting.    Historical Provider, MD   Meds Ordered and Administered this Visit  Medications -  No data to display  BP 143/90 mmHg  Pulse 108  Temp(Src) 99.1 F (37.3 C) (Oral)  Resp 14  Ht 6' (1.829 m)  Wt 157 lb (71.215 kg)  BMI 21.29 kg/m2  SpO2 100% No data found.   Physical Exam  Constitutional: He is oriented to person, place, and time. He appears well-developed and well-nourished. No distress.  HENT:  Head: Atraumatic.  Nose: Nose normal.  Mouth/Throat: Oropharynx is clear and moist.  Eyes: Conjunctivae are normal. Pupils are equal, round, and reactive to light.  Neck: Neck supple.  Cardiovascular: Normal heart sounds.   Note  heart rate 108  Pulmonary/Chest: Breath sounds normal.  Abdominal: There is no tenderness.  Musculoskeletal: He exhibits no edema.       Left elbow: He exhibits swelling and effusion. He exhibits normal range of motion, no deformity and no laceration. Tenderness found. Olecranon process tenderness noted.       Arms: Left olecranon bursa is mildly tender/swollen with erythema and warmth extending distally as noted on diagram.  There is an approximately 1cm diameter eschar over the olecranon.    Lymphadenopathy:    He has no cervical adenopathy.  Neurological: He is alert and oriented to person, place, and time.  Skin: Skin is warm and dry.  Nursing note and vitals reviewed.   ED Course  Procedures none   MDM   1. Cellulitis of elbow; concern for possible septic olecranon bursitis in immunocompromised patient with a history of MRSA abscess left arm.    Advised to proceed immediately to [facility] Emergency Department for further evaluation and treatment (recommend ultrasound guided bursa aspiration).    Kandra Nicolas, MD 02/21/16 [place]

## 2016-02-22 ENCOUNTER — Telehealth: Payer: Self-pay | Admitting: *Deleted

## 2016-10-09 ENCOUNTER — Ambulatory Visit (INDEPENDENT_AMBULATORY_CARE_PROVIDER_SITE_OTHER): Payer: Medicare HMO | Admitting: Physical Therapy

## 2016-10-09 DIAGNOSIS — M6281 Muscle weakness (generalized): Secondary | ICD-10-CM | POA: Diagnosis not present

## 2016-10-09 DIAGNOSIS — R269 Unspecified abnormalities of gait and mobility: Secondary | ICD-10-CM

## 2016-10-09 DIAGNOSIS — R279 Unspecified lack of coordination: Secondary | ICD-10-CM | POA: Diagnosis not present

## 2016-10-09 NOTE — Therapy (Signed)
[place], [place], [address] Phone: [phone]   Fax:  [phone]  Physical Therapy Evaluation  Patient Details  Name: [facility] MRN: [id number] Date of Birth: 07-03-1976 Referring Provider: Dr Tana Coast  Encounter Date: 10/09/2016      PT End of Session - 10/09/16 1609    Visit Number 1   Number of Visits 16   Date for PT Re-Evaluation 12/05/16   PT Start Time [id number]   PT Stop Time 1659   PT Time Calculation (min) 49 min   Equipment Utilized During Treatment Other (comment)  Pt used his walker   Activity Tolerance Patient limited by fatigue      Past Medical History:  Diagnosis Date  . Brain tumor ([place])   . Spine disorder     No past surgical history on file.  There were no vitals filed for this visit.       Subjective Assessment - 10/09/16 1611    Subjective Pt with h/o multiple spinal surgeries, 30 per pt. first one in 1999. Had chemo and radiation 4 times, last one was in 2012. Has scans every 4-6 months.  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 Most recently had PT with [facility] about 3 months ago after falling and having an Lt MCL tear. They worked on [place] out Lt knee. Now having trouble with Rt foot drop this started a couple of months ago. She feels is from weakness and nerve problems. Bilat feet are numb   Pertinent History HOH has cochlear implant. Uses WC at times and a scooter for outside the house. Hasn't been doing HEP from previous therapy.    How long can you stand comfortably? 5 min before fatigue   How long can you walk comfortably? with walker, minimal walking house hold, WC for the store.    Patient Stated Goals gain strength, get a brace for his Rt foot.    Currently in Pain? No/denies  has pain in the Lt knee if it is bumped.                                PT Education - 10/09/16 0738    Education provided Yes    Education Details HEP   Person(s) Educated Patient   Methods Explanation;Handout;Demonstration   Comprehension Verbalized understanding;Returned demonstration          PT Short Term Goals - 10/09/16 0744      PT SHORT TERM GOAL #1   Title complete TUG with walker ( 11/07/16)    Time 4   Period Weeks   Status New     PT SHORT TERM GOAL #2   Title demo gait velocity =/> 2.9 ft/sec with RW and supervision ( 11/07/16)    Time 4   Period Weeks   Status New     PT SHORT TERM GOAL #3   Title demo dynamic sitting balance to allow consistent reaching out of his base of support and no need for upper body assist/no LOB ( 11/07/16)    Time 4   Period Weeks   Status New     PT SHORT TERM GOAL #4   Title Independent in initial HEP for balance and strengthening exercises  (11/07/16)    Time 4   Period Weeks   Status New     PT SHORT TERM GOAL #5   Title  assist with setting up appointment with orthotist as appropriate ( 11/07/16)    Time 4   Period Weeks   Status New     PT SHORT TERM GOAL #6   Title improve FOTO =/< 61% limited CL level ( 11/07/16)    Time 4   Period Weeks   Status New           PT Long Term Goals - 10/09/16 [id number]      PT LONG TERM GOAL #1   Title I with advanced HEP ( 12/05/16)    Time 8   Period Weeks   Status New     PT LONG TERM GOAL #2   Title Improve TUG score to </= 19 secs with RW ( 12/05/16)    Time 8   Period Weeks   Status New     PT LONG TERM GOAL #3   Title demo LE strength =/> 4-/5 throughout ( 12/05/16)    Time 8   Period Weeks   Status New     PT LONG TERM GOAL #4   Title report =/> 50% reduction in falls ( 12/05/16)    Time 8   Period Weeks   Status New     PT LONG TERM GOAL #5   Title improved FOTO =/< 51% limited , CK level ( 12/05/16)    Time 8   Period Weeks   Status New               Plan - 10/09/16 0739    Clinical Impression Statement 40 yo male with multiple medical issues as relates to his spine and  neurological system.  He has been in/out of PT due to falls and back surgeries.  He presents with significant weakness in the lower body, motor control issues, core instability and falls.  He relies on his upper body and assistive devices to help with mobility.  Prognosis is unclear per patient.  He reports MD is interested in him walking with and AFO, he may benefit more from an KAFO to include knee stability. Call will be placed to MD office to gain a better understanding of prognosis and plan for the patient as well as getting an order for LE bracing as indicated.    Rehab Potential Good  for goals set   PT Frequency 2x / week   PT Duration 8 weeks   PT Treatment/Interventions Moist Heat;Therapeutic exercise;Dry needling;Therapeutic activities;Taping;Manual techniques;Balance training;Neuromuscular re-education;DME Instruction;Cryotherapy;Gait training;Stair training;Functional mobility training;Patient/family education;Orthotic Fit/Training   PT Next Visit Plan core strengthening, dynamic sitting activities, possibly high kneel and lower body ther ex.    Recommended Other Services orthotist for possible bracing.    Consulted and Agree with Plan of Care Patient     Spoke with nurse at MD's office, they report he used to be able to walk from the parking lot to their office with his walker and the last couple visits he has to use his WC.  They feel he should be able to get back to his baseline of this along with less falls.  They are also agreeable to the possibility of a KAFO if the patient is open to it.  We will treat for a couple of weeks and re-assess the need for bracing.      Patient will benefit from skilled therapeutic intervention in order to improve the following deficits and impairments:  Postural dysfunction, Decreased strength, Decreased mobility, Decreased balance, Difficulty walking, Decreased coordination  Visit Diagnosis: Abnormality of  gait - Plan: PT plan of care  cert/re-cert  Lack of coordination - Plan: PT plan of care cert/re-cert  Muscle weakness (generalized) - Plan: PT plan of care cert/re-cert     Problem List There are no active problems to display for this patient.   Jeral Pinch PT  10/10/2016, 9:02 AM  [facility] [place], [place], [address] Phone: [phone]   Fax:  [phone]  Name: [facility] MRN: [id number] Date of Birth: 16-May-1976

## 2016-10-09 NOTE — Patient Instructions (Addendum)
Supported Lateral Weight Shift: Lower Trunk Leading    Sit with feet flat on floor, no hands if able, use as needed. Lift opposite hip to bring body weight over right buttock and arm. Keep head upright, chest lifted. Pretend you are two inches taller.  Hold _2-3___ seconds. Repeat __20_ times per session. Do __1-2__ sessions per day. Repeat on opposite side.   Unsupported Anterior / Posterior Weight Shift: Lower Trunk Leading    Sit, feet flat on floor, hands clasped in front. Lean forward through hips, nose over knees. Return. Lean backward through hips. Hold each position __2-3__ seconds. Repeat ___20_ times per session. Do _1-2___ sessions per day.  Knee Extension (Sitting)    Place _0___ pound weight on left ankle and straighten knee fully, lower slowly. Hold 3-5 sec. Repeat __10-20__ times per set. Do __1__ sets per session. Do __1-2__ sessions per day.  Heel Raise: Bilateral (Standing)    Rise on balls of feet. HOld on to a counter for safety. Repeat __10-20__ times per set. Do __1__ sets per session. Do __1-2__ sessions per day.  Toe Raise (Sitting)    Raise toes, keeping heels on floor. Repeat __10-20_ times per set. Do __1_ sets per session. Do _1-2_ sessions per day.  Copyright  VHI. All rights reserved.

## 2016-10-10 DIAGNOSIS — M6281 Muscle weakness (generalized): Secondary | ICD-10-CM

## 2016-10-10 DIAGNOSIS — R269 Unspecified abnormalities of gait and mobility: Secondary | ICD-10-CM

## 2016-10-10 DIAGNOSIS — R279 Unspecified lack of coordination: Secondary | ICD-10-CM

## 2016-10-12 ENCOUNTER — Ambulatory Visit (INDEPENDENT_AMBULATORY_CARE_PROVIDER_SITE_OTHER): Payer: Medicare HMO | Admitting: Physical Therapy

## 2016-10-12 ENCOUNTER — Encounter: Payer: Self-pay | Admitting: Physical Therapy

## 2016-10-12 ENCOUNTER — Encounter (INDEPENDENT_AMBULATORY_CARE_PROVIDER_SITE_OTHER): Payer: Self-pay

## 2016-10-12 DIAGNOSIS — R269 Unspecified abnormalities of gait and mobility: Secondary | ICD-10-CM

## 2016-10-12 DIAGNOSIS — R279 Unspecified lack of coordination: Secondary | ICD-10-CM

## 2016-10-12 DIAGNOSIS — M6281 Muscle weakness (generalized): Secondary | ICD-10-CM

## 2016-10-12 NOTE — Therapy (Signed)
[place], [place], [address] Phone: [phone]   Fax:  [phone]  Physical Therapy Treatment  Patient Details  Name: Derek Day MRN: [id number] Date of Birth: 01-30-1976 Referring Provider: Dr Tana Coast  Encounter Date: 10/12/2016      PT End of Session - 10/12/16 1345    Visit Number 2   Number of Visits 16   Date for PT Re-Evaluation 12/05/16   PT Start Time 0930   PT Stop Time 1016   PT Time Calculation (min) 46 min   Activity Tolerance Patient tolerated treatment well;Patient limited by fatigue   Behavior During Therapy [facility] for tasks assessed/performed      Past Medical History:  Diagnosis Date  . Brain tumor ([place])   . Spine disorder     History reviewed. No pertinent surgical history.  There were no vitals filed for this visit.      Subjective Assessment - 10/12/16 0936    Subjective Legs got a little tired after the exercises at home. No problems or questions.    Currently in Pain? No/denies                         [facility] Adult PT Treatment/Exercise - 10/12/16 0001      Ambulation/Gait   Gait Comments Poor dorsiflexion bilaterally Rt>Lt. Trial T-band dorsiflexion assist with noted improved foot clearance during swing phase.      Posture/Postural Control   Posture Comments cues for neutral sitting as tolerated during session, verbal+tactile cues     Lumbar Exercises: Seated   Other Seated Lumbar Exercises seated forward, R/L diagonal leaning (with cones). Working on trunk control and minimal UE assist     Knee/Hip Exercises: Aerobic   Nustep L2 X5 min     Knee/Hip Exercises: Seated   Marching Both;1 set;10 reps   Marching Limitations max assist Rt, min assist Lt - focus on eccentric control and breathing   Sit to Sand 1 set;10 reps  from elevated surface, minimizing UE use (rollator in front)                PT Education -  10/12/16 1136    Education provided Yes   Education Details encouraging HEP, posture in sitting position   Person(s) Educated Patient   Methods Explanation;Demonstration;Verbal cues   Comprehension Verbalized understanding;Returned demonstration;Need further instruction          PT Short Term Goals - 10/10/16 0744      PT SHORT TERM GOAL #1   Title complete TUG with walker ( 11/07/16)    Time 4   Period Weeks   Status New     PT SHORT TERM GOAL #2   Title demo gait velocity =/> 2.9 ft/sec with RW and supervision ( 11/07/16)    Time 4   Period Weeks   Status New     PT SHORT TERM GOAL #3   Title demo dynamic sitting balance to allow consistent reaching out of his base of support and no need for upper body assist/no LOB ( 11/07/16)    Time 4   Period Weeks   Status New     PT SHORT TERM GOAL #4   Title Independent in initial HEP for balance and strengthening exercises  (11/07/16)    Time 4   Period Weeks   Status New     PT SHORT TERM GOAL #5   Title assist with  setting up appointment with orthotist as appropriate ( 11/07/16)    Time 4   Period Weeks   Status New     PT SHORT TERM GOAL #6   Title improve FOTO =/< 61% limited CL level ( 11/07/16)    Time 4   Period Weeks   Status New           PT Long Term Goals - 10/10/16 [id number]      PT LONG TERM GOAL #1   Title I with advanced HEP ( 12/05/16)    Time 8   Period Weeks   Status New     PT LONG TERM GOAL #2   Title Improve TUG score to </= 19 secs with RW ( 12/05/16)    Time 8   Period Weeks   Status New     PT LONG TERM GOAL #3   Title demo LE strength =/> 4-/5 throughout ( 12/05/16)    Time 8   Period Weeks   Status New     PT LONG TERM GOAL #4   Title report =/> 50% reduction in falls ( 12/05/16)    Time 8   Period Weeks   Status New     PT LONG TERM GOAL #5   Title improved FOTO =/< 51% limited , CK level ( 12/05/16)    Time 8   Period Weeks   Status New               Plan -  10/12/16 1346    Clinical Impression Statement Pt noted to fatigue quickly during session, rest breaks provided as needed. Session including trunk stabilization activities as well as LE strengthening. During ambulation the pt was noted to have poor foot clearance with swing phase Rt>Lt. Pattern did improve with band assisted dorsiflexion on Rt. Pt remains appropriate for continue skilled PT sessions.    Rehab Potential Good   PT Frequency 2x / week   PT Duration 8 weeks   PT Treatment/Interventions Moist Heat;Therapeutic exercise;Dry needling;Therapeutic activities;Taping;Manual techniques;Balance training;Neuromuscular re-education;DME Instruction;Cryotherapy;Gait training;Stair training;Functional mobility training;Patient/family education;Orthotic Fit/Training   PT Next Visit Plan core/trunk stabilization, LE strengthening and gait/balance training. Possible quadraped exercises.    Consulted and Agree with Plan of Care Patient      Patient will benefit from skilled therapeutic intervention in order to improve the following deficits and impairments:  Postural dysfunction, Decreased strength, Decreased mobility, Decreased balance, Difficulty walking, Decreased coordination  Visit Diagnosis: Abnormality of gait  Lack of coordination  Muscle weakness (generalized)     Problem List There are no active problems to display for this patient.   Linard Millers, PT, CSCS 10/12/2016, 1:55 PM  [facility] [place], [place], [address] Phone: [phone]   Fax:  [phone]  Name: Derek Day MRN: [id number] Date of Birth: 10-15-1976

## 2016-10-16 ENCOUNTER — Encounter: Payer: Medicare HMO | Admitting: Physical Therapy

## 2016-10-16 ENCOUNTER — Ambulatory Visit (INDEPENDENT_AMBULATORY_CARE_PROVIDER_SITE_OTHER): Payer: Medicare HMO | Admitting: Physical Therapy

## 2016-10-16 DIAGNOSIS — R269 Unspecified abnormalities of gait and mobility: Secondary | ICD-10-CM | POA: Diagnosis not present

## 2016-10-16 DIAGNOSIS — M6281 Muscle weakness (generalized): Secondary | ICD-10-CM

## 2016-10-16 DIAGNOSIS — R279 Unspecified lack of coordination: Secondary | ICD-10-CM | POA: Diagnosis not present

## 2016-10-16 NOTE — Therapy (Signed)
[place], [place], [address] Phone: [phone]   Fax:  [phone]  Physical Therapy Treatment  Patient Details  Name: Derek Day MRN: [id number] Date of Birth: 1976-07-11 Referring Provider: Dr. Tana Coast   Encounter Date: 10/16/2016      PT End of Session - 10/16/16 0938    Visit Number 3   Number of Visits 16   Date for PT Re-Evaluation 12/05/16   PT Start Time 0932   PT Stop Time 1010   PT Time Calculation (min) 38 min   Activity Tolerance Patient tolerated treatment well;Patient limited by fatigue   Behavior During Therapy [facility] for tasks assessed/performed      Past Medical History:  Diagnosis Date  . Brain tumor ([place])   . Spine disorder     No past surgical history on file.  There were no vitals filed for this visit.      Subjective Assessment - 10/16/16 0939    Subjective Pt reports he hasn't had any falls since last visit.  "I can really feel the muscles working with the core exercises".     Currently in Pain? No/denies            [facility] PT Assessment - 10/16/16 0001      Assessment   Medical Diagnosis deconditioning, Rt foot drop   Referring Provider Dr. Tana Coast    Hand Dominance Right   Next MD Visit every two weeks for infusion therapy      Balance   Balance Assessed Yes           [facility] Adult PT Treatment/Exercise - 10/16/16 0001      Standardized Balance Assessment   Standardized Balance Assessment Timed Up and Go Test     Timed Up and Go Test   TUG Normal TUG   Normal TUG (seconds) 39     Lumbar Exercises: Aerobic   Stationary Bike NuStep: L3 (legs only) slow speed, 6.5 min      Lumbar Exercises: Standing   Other Standing Lumbar Exercises Side stepping with UE support on elevated high/low table x 5 feet Rt/Lt x 2 sets (seated rest break between sets). VC for increased step height to clear feet.      Other Standing Lumbar  Exercises Standing balance with occasional UE support on elevated high low table, SBA- CGA for safety.       Lumbar Exercises: Seated   Sit to Stand 5 reps  heavy UE assist   Other Seated Lumbar Exercises scap retraction x 5 sec hold x 12 reps     Knee/Hip Exercises: Standing   Gait Training Retro gait with RW x 12 ft - VC for sequence and safety.      Knee/Hip Exercises: Seated   Long Arc Quad Right;Left;1 set;5 reps  not full ROM on RLE)    Other Seated Knee/Hip Exercises Heel raises x 15 reps; Toe raises with mod assist for Rt foot x 10 reps    Marching Both;2 sets;5 reps   Marching Limitations max assist Rt, min assist Lt - focus on eccentric control and breathing                  PT Short Term Goals - 10/16/16 0954      PT SHORT TERM GOAL #1   Title complete TUG with walker ( 11/07/16)    Time 4   Period Weeks   Status Achieved  PT SHORT TERM GOAL #2   Title demo gait velocity =/> 2.9 ft/sec with RW and supervision ( 11/07/16)    Time 4   Period Weeks   Status On-going     PT SHORT TERM GOAL #3   Title demo dynamic sitting balance to allow consistent reaching out of his base of support and no need for upper body assist/no LOB ( 11/07/16)    Time 4   Period Weeks   Status On-going     PT SHORT TERM GOAL #4   Title Independent in initial HEP for balance and strengthening exercises  (11/07/16)    Time 4   Period Weeks   Status On-going     PT SHORT TERM GOAL #5   Title assist with setting up appointment with orthotist as appropriate ( 11/07/16)    Time 4   Period Weeks   Status On-going     PT SHORT TERM GOAL #6   Title improve FOTO =/< 61% limited CL level ( 11/07/16)    Time 4   Period Weeks   Status On-going           PT Long Term Goals - 10/16/16 0941      PT LONG TERM GOAL #1   Title I with advanced HEP ( 12/05/16)    Time 8   Period Weeks   Status On-going     PT LONG TERM GOAL #2   Title Improve TUG score to </= 19 secs with  RW ( 12/05/16)    Time 8   Period Weeks   Status On-going     PT LONG TERM GOAL #3   Title demo LE strength =/> 4-/5 throughout ( 12/05/16)    Time 8   Period Weeks   Status On-going     PT LONG TERM GOAL #4   Title report =/> 50% reduction in falls ( 12/05/16)    Time 8   Period Weeks   Status On-going  average 4 falls per week prior to therapy     PT LONG TERM GOAL #5   Title improved FOTO =/< 51% limited , CK level ( 12/05/16)    Time 8   Period Weeks   Status On-going               Plan - 10/16/16 1335    Clinical Impression Statement Pt continues to fatigue quickly during session, seated rest breaks provided as needed.  LE strengthening and standing balance/ gait activities included in today's session.  Pt continues with poor Rt foot clearance during swing phase.  Pt will benefit from continued PT intervention to maximize safety and functional mobility.     Rehab Potential Good   PT Frequency 2x / week   PT Duration 8 weeks   PT Treatment/Interventions Moist Heat;Therapeutic exercise;Dry needling;Therapeutic activities;Taping;Manual techniques;Balance training;Neuromuscular re-education;DME Instruction;Cryotherapy;Gait training;Stair training;Functional mobility training;Patient/family education;Orthotic Fit/Training   PT Next Visit Plan core/trunk stabilization, LE strengthening and gait/balance training. Possible quadruped exercises.    Consulted and Agree with Plan of Care Patient      Patient will benefit from skilled therapeutic intervention in order to improve the following deficits and impairments:  Postural dysfunction, Decreased strength, Decreased mobility, Decreased balance, Difficulty walking, Decreased coordination  Visit Diagnosis: Abnormality of gait  Lack of coordination  Muscle weakness (generalized)     Problem List There are no active problems to display for this patient.   Kerin Perna, PTA 10/16/16 2:22 PM    [facility] [place], [place], [address] Phone: [phone]   Fax:  [phone]  Name: Derek Day MRN: [id number] Date of Birth: [birth year]

## 2016-10-19 ENCOUNTER — Ambulatory Visit (INDEPENDENT_AMBULATORY_CARE_PROVIDER_SITE_OTHER): Payer: Medicare HMO | Admitting: Physical Therapy

## 2016-10-19 DIAGNOSIS — R279 Unspecified lack of coordination: Secondary | ICD-10-CM | POA: Diagnosis not present

## 2016-10-19 DIAGNOSIS — M6281 Muscle weakness (generalized): Secondary | ICD-10-CM | POA: Diagnosis not present

## 2016-10-19 DIAGNOSIS — R269 Unspecified abnormalities of gait and mobility: Secondary | ICD-10-CM

## 2016-10-19 NOTE — Therapy (Addendum)
[place], [place], [address] Phone: [phone]   Fax:  [phone]  Physical Therapy Treatment  Patient Details  Name: Derek Day MRN: [id number] Date of Birth: [birth year] Referring Provider: Dr. Tana Coast   Encounter Date: 10/19/2016      PT End of Session - 10/19/16 0944    Visit Number 4   Number of Visits 16   Date for PT Re-Evaluation 12/05/16   PT Start Time 0931   PT Stop Time 1012   PT Time Calculation (min) 41 min      Past Medical History:  Diagnosis Date  . Brain tumor ([place])   . Spine disorder     No past surgical history on file.  There were no vitals filed for this visit.      Subjective Assessment - 10/19/16 0946    Subjective Derek Day states he felt like he was able to go up stairs a lot easier today.  He states he tripped yesterday and fell over child's hula hoop.  he reports his energy tank is 3/4 full.             [facility] PT Assessment - 10/19/16 0001      Assessment   Medical Diagnosis deconditioning, Rt foot drop   Referring Provider Dr. Tana Coast    Hand Dominance Right   Next MD Visit every two weeks for infusion therapy           [facility] Adult PT Treatment/Exercise - 10/19/16 0001      Lumbar Exercises: Stretches   Passive Hamstring Stretch 1 rep;30 seconds  each side, assist from PTA     Lumbar Exercises: Aerobic   Stationary Bike NuStep: L3 (legs only) up to 60 spm, 6 min      Lumbar Exercises: Standing   Other Standing Lumbar Exercises Marching in place with hands on Rollator x 12 reps, tactile cues for improved posture.      Lumbar Exercises: Seated   Sit to Stand --  6 reps from elevated high/low table. UE on rollator.    Sit to Stand Limitations VC for controlled descent, focus on more use of LE.    Other Seated Lumbar Exercises scap retraction x 5 sec hold x 10 reps, back unsupported, working on trunk balance.  Seated  balance:  Rings on arch (highest level) reaching slightly outside base of support x 12 reps each side.   Seated balance on high/low table without UE support and tactile cues for upright posture (pt fatigued quickly).       Knee/Hip Exercises: Seated   Other Seated Knee/Hip Exercises Heel raises x 15 reps; Toe raises with mod assist for Rt foot x 10 reps    Marching Both;5 reps;1 set   Marching Limitations max assist Rt, min assist Lt - focus on eccentric control and breathing.  Pt began getting headache despite improved breathing; stopped.                   PT Short Term Goals - 10/16/16 0954      PT SHORT TERM GOAL #1   Title complete TUG with walker ( 11/07/16)    Time 4   Period Weeks   Status Achieved     PT SHORT TERM GOAL #2   Title demo gait velocity =/> 2.9 ft/sec with RW and supervision ( 11/07/16)    Time 4   Period Weeks   Status On-going  PT SHORT TERM GOAL #3   Title demo dynamic sitting balance to allow consistent reaching out of his base of support and no need for upper body assist/no LOB ( 11/07/16)    Time 4   Period Weeks   Status On-going     PT SHORT TERM GOAL #4   Title Independent in initial HEP for balance and strengthening exercises  (11/07/16)    Time 4   Period Weeks   Status On-going     PT SHORT TERM GOAL #5   Title assist with setting up appointment with orthotist as appropriate ( 11/07/16)    Time 4   Period Weeks   Status On-going     PT SHORT TERM GOAL #6   Title improve FOTO =/< 61% limited CL level ( 11/07/16)    Time 4   Period Weeks   Status On-going           PT Long Term Goals - 10/16/16 0941      PT LONG TERM GOAL #1   Title I with advanced HEP ( 12/05/16)    Time 8   Period Weeks   Status On-going     PT LONG TERM GOAL #2   Title Improve TUG score to </= 19 secs with RW ( 12/05/16)    Time 8   Period Weeks   Status On-going     PT LONG TERM GOAL #3   Title demo LE strength =/> 4-/5 throughout (  12/05/16)    Time 8   Period Weeks   Status On-going     PT LONG TERM GOAL #4   Title report =/> 50% reduction in falls ( 12/05/16)    Time 8   Period Weeks   Status On-going  average 4 falls per week prior to therapy     PT LONG TERM GOAL #5   Title improved FOTO =/< 51% limited , CK level ( 12/05/16)    Time 8   Period Weeks   Status On-going               Plan - 10/19/16 1125    Clinical Impression Statement Pt fatigued quickly with seated balance exercises inside and slightly outside base of support (unsupported back and no UE support); this remains a challenging activity. RLE remains weak and fatigues quickly with exercise.  Pt reports his activity tolerance has improved at home since initiating therapy.  Pt remains motivated to progress towards established goals and will benefit from continued PT intervention.    Rehab Potential Good   PT Frequency 2x / week   PT Duration 8 weeks   PT Treatment/Interventions Moist Heat;Therapeutic exercise;Dry needling;Therapeutic activities;Taping;Manual techniques;Balance training;Neuromuscular re-education;DME Instruction;Cryotherapy;Gait training;Stair training;Functional mobility training;Patient/family education;Orthotic Fit/Training   PT Next Visit Plan core/trunk stabilization, LE strengthening and gait/balance training.    PT Home Exercise Plan seated balance with scap squeeze, toe taps.     Consulted and Agree with Plan of Care Patient      Patient will benefit from skilled therapeutic intervention in order to improve the following deficits and impairments:  Postural dysfunction, Decreased strength, Decreased mobility, Decreased balance, Difficulty walking, Decreased coordination  Visit Diagnosis: Abnormality of gait  Lack of coordination  Muscle weakness (generalized)     Problem List There are no active problems to display for this patient.  Kerin Perna, PTA 10/19/16 11:32 AM  [place], [place], [address] Phone: [phone]  Fax:  [phone]  Name: Derek Day MRN: [id number] Date of Birth: Jun 24, 1976   PHYSICAL THERAPY DISCHARGE SUMMARY  Visits from Start of Care: 4  Current functional level related to goals / functional outcomes: unknown   Remaining deficits: unknown   Education / Equipment: HEP Plan:                                                    Patient goals were not met. Patient is being discharged due to not returning since the last visit.  ?????     Jeral Pinch, PT 11/17/16 9:58 AM

## 2017-01-03 ENCOUNTER — Other Ambulatory Visit (HOSPITAL_COMMUNITY): Payer: Self-pay | Admitting: Oncology

## 2017-01-03 ENCOUNTER — Ambulatory Visit: Payer: Medicare HMO | Admitting: Occupational Therapy

## 2017-01-03 ENCOUNTER — Ambulatory Visit: Payer: Medicare HMO | Attending: Oncology | Admitting: Physical Therapy

## 2017-01-03 DIAGNOSIS — M6281 Muscle weakness (generalized): Secondary | ICD-10-CM

## 2017-01-03 DIAGNOSIS — R279 Unspecified lack of coordination: Secondary | ICD-10-CM | POA: Diagnosis present

## 2017-01-03 DIAGNOSIS — C711 Malignant neoplasm of frontal lobe: Secondary | ICD-10-CM

## 2017-01-03 DIAGNOSIS — R29818 Other symptoms and signs involving the nervous system: Secondary | ICD-10-CM | POA: Diagnosis present

## 2017-01-03 DIAGNOSIS — R269 Unspecified abnormalities of gait and mobility: Secondary | ICD-10-CM

## 2017-01-03 DIAGNOSIS — R208 Other disturbances of skin sensation: Secondary | ICD-10-CM | POA: Diagnosis present

## 2017-01-03 DIAGNOSIS — R2689 Other abnormalities of gait and mobility: Secondary | ICD-10-CM | POA: Diagnosis present

## 2017-01-03 DIAGNOSIS — R278 Other lack of coordination: Secondary | ICD-10-CM | POA: Insufficient documentation

## 2017-01-03 DIAGNOSIS — R262 Difficulty in walking, not elsewhere classified: Secondary | ICD-10-CM | POA: Diagnosis present
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
## 2017-01-10 ENCOUNTER — Ambulatory Visit: Payer: Medicare HMO | Admitting: Physical Therapy

## 2017-01-10 ENCOUNTER — Ambulatory Visit: Payer: Medicare HMO | Admitting: Occupational Therapy

## 2017-01-10 DIAGNOSIS — R278 Other lack of coordination: Secondary | ICD-10-CM

## 2017-01-10 DIAGNOSIS — R29818 Other symptoms and signs involving the nervous system: Secondary | ICD-10-CM

## 2017-01-10 DIAGNOSIS — R269 Unspecified abnormalities of gait and mobility: Secondary | ICD-10-CM

## 2017-01-10 DIAGNOSIS — M6281 Muscle weakness (generalized): Secondary | ICD-10-CM

## 2017-01-10 DIAGNOSIS — R262 Difficulty in walking, not elsewhere classified: Secondary | ICD-10-CM

## 2017-01-10 NOTE — Therapy (Addendum)
[facility] [address]  [place], [place], [address] Phone: [phone]   Fax:  [phone]  Physical Therapy Treatment  Patient Details  Name: Derek Day MRN: [id number] Date of Birth: Jan 19, 1976 Referring Provider: Tana Coast, MD  Encounter Date: 01/10/2017      PT End of Session - 01/10/17 0933    Visit Number 2   Number of Visits 16   Date for PT Re-Evaluation 03/02/17   Authorization Type [employer] Medicare - follows Medicare guidelines   PT Start Time 0933   PT Stop Time 1016   PT Time Calculation (min) 43 min   Equipment Utilized During Treatment Gait belt   Activity Tolerance Patient tolerated treatment well   Behavior During Therapy [facility] for tasks assessed/performed      Past Medical History:  Diagnosis Date  . Brain tumor ([place])   . Spine disorder     No past surgical history on file.  There were no vitals filed for this visit.      Subjective Assessment - 01/10/17 0938    Subjective Pt w/o new complaints or concerns today. Denies pain.   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 limited household ambulation; power WC for commuity access   Patient Stated Goals "Able to stand up on the first try."   Currently in Pain? No/denies   Pain Onset More than a month ago   Pain Onset More than a month ago                         [facility] Adult PT Treatment/Exercise - 01/10/17 0933      Transfers   Transfers Sit to Stand;Stand to Sit;Squat Pivot Transfers;Supine to Sit;Sit to Supine   Sit to Stand 4: Min guard;With upper extremity assist;With armrests;Multiple attempts;From bed;From chair/3-in-1   Stand to Sit 4: Min guard;With upper extremity assist;With armrests;To bed;To  chair/3-in-1;Uncontrolled descent   Squat Pivot Transfers 4: Min guard;With upper extremity assistance   Supine to Sit 3: Mod assist   Supine to Sit Details (indicate cue type and reason) assist to lift LE's onto mat   Sit to Supine 4: Min assist   Sit to Supine Details  assist for LE's     Ambulation/Gait   Ambulation/Gait Assistance 4: Min guard   Ambulation Distance (Feet) 100 Feet  40 + 35 + 25   Assistive device 4-wheeled walker   Gait Pattern Step-to pattern;Trunk flexed;Decreased stride length;Decreased step length - right;Decreased step length - left;Decreased hip/knee flexion - right;Decreased dorsiflexion - right;Poor foot clearance - right   Ambulation Surface Level;Indoor     Exercises   Exercises Knee/Hip     Knee/Hip Exercises: Seated   Long Arc Quad Both;10 reps   Long Arc Quad Limitations 3" hold, difficulty with TKE on R & only able to complete 6 reps on R d/t fatigue   Heel Slides Both;10 reps   Heel Slides Limitations plastic bag under foot on carpet & seat height elevated with Airex pad   Marching Both;10 reps;AROM;AAROM   Marching Limitations AAROM on R   Abduction/Adduction  Both;10 reps   Abd/Adduction Limitations knee flexed with feet on floor     Knee/Hip Exercises: Supine   Quad  Sets Both;10 reps   Quad Sets Limitations 5" hold   Bridges Both;5 reps   Bridges Limitations PT stabilizing knees   Other Supine Knee/Hip Exercises B Glute & HS sets 10x5" each                PT Education - 01/10/17 1015    Education provided Yes   Education Details Initial HEP   Person(s) Educated Patient   Methods Explanation;Demonstration;Handout   Comprehension Verbalized understanding;Returned demonstration;Need further instruction          PT Short Term Goals - 01/10/17 1016      PT SHORT TERM GOAL #1   Title Independent with initial HEP for balance and strengthening exercises by 02/02/17   Status On-going     PT SHORT TERM GOAL #2   Title Demo  gait velocity =/> 1.31 ft/sec with RW and supervision by 02/02/17   Status On-going     PT SHORT TERM GOAL #3   Title Demo dynamic sitting balance to allow consistent reaching out of his base of support and no need for upper body assist/no LOB by 02/02/17   Status On-going     PT SHORT TERM GOAL #4   Status On-going     PT SHORT TERM GOAL #5   Status On-going           PT Long Term Goals - 01/10/17 1031      PT LONG TERM GOAL #1   Title Independent with advanced HEP by 03/02/17   Status On-going     PT LONG TERM GOAL #2   Title Improve TUG score to </= 25 secs with RW by 03/02/17    Status On-going     PT LONG TERM GOAL #3   Title Demo LE strength =/> 3+/5 throughout B LEs by 03/02/17   Status On-going     PT LONG TERM GOAL #4   Title Demo gait velocity =/> 1.8 ft/sec with RW and consistent R foot clearance to reduce risk for falls by 02/02/17   Status On-going     PT LONG TERM GOAL #5   Title Report =/> 50% reduction in falls by 03/02/17   Status On-going               Plan - 01/10/17 1032    Clinical Impression Statement Introduced basic LE strengthening HEP in supine and sitting. Pt lacking antigravity control with most exercises on R and severe weakness with limited coordination on L. Pt fatigues quickly with most exercises completing less than 10 reps with some due to fatigue.   Rehab Potential Good   Clinical Impairments Affecting Rehab Potential HOH with cochlear implant, B numbness in feet, R foot drop with new AFO, recurrent falls with h/o injuries related to falls   PT Treatment/Interventions Patient/family education;Neuromuscular re-education;Therapeutic exercise;Therapeutic activities;Functional mobility training;Gait training;Stair training;Manual techniques;Taping;Cryotherapy;Moist Heat;ADLs/Self Care Home Management   PT Next Visit Plan Review and progress initial HEP for LE strengthening as appropriate; unsupported sitting balance; transfer training;  gait training   Consulted and Agree with Plan of Care Patient      Patient will benefit from skilled therapeutic intervention in order to improve the following deficits and impairments:  Abnormal gait, Difficulty walking, Decreased strength, Decreased activity tolerance, Decreased balance, Decreased coordination, Decreased mobility, Decreased range of motion, Decreased safety awareness, Impaired flexibility, Postural dysfunction, Pain  Visit Diagnosis: Abnormality of gait  Difficulty in walking, not elsewhere classified  Other lack of coordination  Muscle weakness (generalized)  Problem List There are no active problems to display for this patient.   Percival Spanish, PT, MPT 01/10/2017, 12:59 PM  [facility] [address]  [place], [place], [address] Phone: [phone]   Fax:  [phone]  Name: Derek Day MRN: [id number] Date of Birth: 1976-08-13

## 2017-01-10 NOTE — Patient Instructions (Signed)
  Coordination Activities  Perform the following activities for 15 minutes 1-2 times per day with left hand(s).   Rotate ball in fingertips (clockwise and counter-clockwise).  Flip cards 1 at a time as fast as you can, grabbing with duck bill position.  Deal cards with your thumb (Hold deck in hand and push card off top with thumb).  Rotate card in hand (clockwise and counter-clockwise).  Pick up coins one at a time until you get 5 in your hand, then move coins from palm to fingertips to stack one at a time.  Twirl pen between fingers.  1. Grip Strengthening (Resistive Putty)   Squeeze putty using thumb and all fingers. Repeat _15___ times. Do __2__ sessions per day.   2. Roll putty into tube on table and pinch between each finger and thumb x 10 reps each. (Do ring and small finger together)   3. MP Flexion (Resistive Putty)    Bending only at large knuckles, press putty down against thumb. Keep fingertips straight. Pull apart  Repeat __10__ times. Do _2___ sessions per day.

## 2017-01-10 NOTE — Therapy (Signed)
[facility] [address]  [place], [place], [address] Phone: [phone]   Fax:  [phone]  Occupational Therapy Treatment  Patient Details  Name: Derek Day MRN: [id number] Date of Birth: [birth year] Referring Provider: Dr. Tana Coast  Encounter Date: 01/10/2017      OT End of Session - 01/10/17 1400    Visit Number 2   Number of Visits 13   Date for OT Re-Evaluation 02/21/17   Authorization Type [employer] MCR - G code needed   Authorization - Visit Number 2   Authorization - Number of Visits 10   OT Start Time [id number]   OT Stop Time 1100   OT Time Calculation (min) 45 min   Activity Tolerance Patient tolerated treatment well      Past Medical History:  Diagnosis Date  . Brain tumor ([place])   . Spine disorder     No past surgical history on file.  There were no vitals filed for this visit.      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 No/denies                      OT Treatments/Exercises (OP) - 01/10/17 1104      Exercises   Exercises Hand     Hand Exercises   Other Hand Exercises See pt instructions for putty HEP - issued red putty for home use   Other Hand Exercises Ulnar n. gliding HEP provided      Fine Motor Coordination   Other Fine Motor Exercises see pt instructions for coordination HEP                 OT Education - 01/10/17 1355    Education provided Yes   Education Details Coordination, putty, and ulnar n. gliding HEP    Person(s) Educated Patient   Methods Explanation;Demonstration;Handout   Comprehension Verbalized understanding;Returned demonstration           OT Short Term Goals - 01/10/17 1400      OT SHORT TERM GOAL #1   Title Independent with HEP for coordination and hand strength LUE   Time 3   Period Weeks   Status On-going     OT SHORT TERM GOAL #2   Title Improve coordination Lt hand as evidenced by performing 9 hole peg test in 35 sec. or less   Baseline eval: 41.69 sec   Time 3   Period Weeks   Status New     OT SHORT TERM GOAL #3   Title Improve Lt grip strength to 55 lbs or greater to assist with opening jars/containters   Baseline eval = 48 lbs (Rt = 107 lbs)    Time 3   Period Weeks   Status New     OT SHORT TERM GOAL #4   Title Pt to verbalize understanding with safety recommendations for ulnar n. deficits and HEP for ulnar nerve gliding and intrinsic movement of hand   Time 3   Period Weeks   Status On-going           OT Long Term Goals - 01/03/17 1508      OT LONG TERM GOAL #  1   Title Pt independent with updated HEP to maintain bilateral sh. strength and integrity   Time 6   Period Weeks   Status New     OT LONG TERM GOAL #2   Title Pt to verbalize understanding with necessary DME and modifications/compensations to increase ease and safety with ADLS (especially when standing to don pants)    Time 6   Period Weeks   Status New     OT LONG TERM GOAL #3   Title Improve Lt grip strength to 60 lbs or greater   Baseline eval = 48 lbs   Time 6   Period Weeks   Status New               Plan - 01/10/17 1400    Clinical Impression Statement Pt making progress towards STG's for coordination and strength LUE.    Rehab Potential Good   Clinical Impairments Affecting Rehab Potential Severity of deficits, active CA   OT Frequency 2x / week   OT Duration 6 weeks   OT Treatment/Interventions Self-care/ADL training;Moist Heat;DME and/or AE instruction;Splinting;Patient/family education;Therapeutic activities;Therapeutic exercises;Neuromuscular education;Functional Mobility Training;Passive range of  motion;Parrafin;Manual Therapy;Ultrasound   Plan Review HEPs prn, add finger abd/add ex, coordination and gripping activities   Consulted and Agree with Plan of Care Patient      Patient will benefit from skilled therapeutic intervention in order to improve the following deficits and impairments:  Decreased coordination, Decreased endurance, Impaired sensation, Impaired tone, Decreased activity tolerance, Improper body mechanics, Decreased balance, Decreased knowledge of use of DME, Impaired UE functional use, Decreased mobility, Decreased strength  Visit Diagnosis: Other lack of coordination  Muscle weakness (generalized)  Other symptoms and signs involving the nervous system    Problem List There are no active problems to display for this patient.   Carey Bullocks, OTR/L 01/10/2017, 2:02 PM  [facility] [address]  [place], [place], [address] Phone: [phone]   Fax:  [phone]  Name: Derek Day MRN: [id number] Date of Birth: 01-09-1976

## 2017-01-15 ENCOUNTER — Ambulatory Visit: Payer: Medicare HMO | Admitting: Occupational Therapy

## 2017-01-15 ENCOUNTER — Ambulatory Visit: Payer: Medicare HMO | Attending: Oncology | Admitting: Physical Therapy

## 2017-01-15 DIAGNOSIS — R278 Other lack of coordination: Secondary | ICD-10-CM

## 2017-01-15 DIAGNOSIS — M6281 Muscle weakness (generalized): Secondary | ICD-10-CM

## 2017-01-15 DIAGNOSIS — R262 Difficulty in walking, not elsewhere classified: Secondary | ICD-10-CM | POA: Diagnosis present

## 2017-01-15 DIAGNOSIS — R269 Unspecified abnormalities of gait and mobility: Secondary | ICD-10-CM | POA: Diagnosis present

## 2017-01-15 NOTE — Patient Instructions (Signed)
Abduction / Adduction (Active)    With hand flat on table, spread all fingers apart, then bring them together as close as possible. Repeat __10__ times. Do _2___ sessions per day.

## 2017-01-15 NOTE — Therapy (Addendum)
[facility] [address]  [place], [place], [address] Phone: [phone]   Fax:  [phone]  Physical Therapy Treatment  Patient Details  Name: Derek Day MRN: [id number] Date of Birth: 11-14-1975 Referring Provider: Tana Coast, MD  Encounter Date: 01/15/2017      PT End of Session - 01/15/17 1019    Visit Number 3   Number of Visits 16   Date for PT Re-Evaluation 03/02/17   Authorization Type [employer] Medicare - follows Medicare guidelines   PT Start Time 1019   PT Stop Time 1102   PT Time Calculation (min) 43 min   Equipment Utilized During Treatment Gait belt   Activity Tolerance Patient tolerated treatment well   Behavior During Therapy WFL for tasks assessed/performed      Past Medical History:  Diagnosis Date  . Brain tumor ([place])   . Spine disorder     No past surgical history on file.  There were no vitals filed for this visit.      Subjective Assessment - 01/15/17 1027    Subjective Pt denies any issues with HEP and tries to complete exercises at least 1x/day but has done up to 3x/day.   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 with cochlear implant; B feet are numb; fall on stairs last year resulting in L MCL tear   How long can you walk comfortably? limited household ambulation; power WC for commuity access   Patient Stated Goals "Able to stand up on the first try."   Currently in Pain? No/denies   Pain Onset More than a month ago                         [facility] Adult PT Treatment/Exercise - 01/15/17 1019      Transfers   Sit to Stand 4: Min guard;With upper extremity assist;With armrests;Multiple attempts;From bed;From chair/3-in-1   Stand to Sit 4: Min guard;With upper extremity assist;With armrests;To bed;To chair/3-in-1;Uncontrolled descent   Supine to Sit 4: Min assist   Supine to  Sit Details (indicate cue type and reason) assist to lift LE's onto mat   Sit to Supine 4: Min assist   Sit to Supine Details  assist for LE's     Ambulation/Gait   Ambulation/Gait Assistance 4: Min guard   Ambulation Distance (Feet) 100 Feet   Assistive device 4-wheeled walker   Gait Pattern Step-to pattern;Trunk flexed;Decreased stride length;Decreased step length - right;Decreased step length - left;Decreased hip/knee flexion - right;Decreased dorsiflexion - right;Poor foot clearance - right   Ambulation Surface Level;Indoor     Knee/Hip Exercises: Seated   Long Arc Quad Both;10 reps   Long [employer] Limitations 3" hold; seat height elevated with Airex pad & R thigh slightly elevated with folded towel under leg to reduce heel drag on floor when initiating movement    Heel Slides Both;10 reps   Heel Slides Limitations plastic bag under foot on carpet;  seat height elevated with Airex pad & R thigh slightly elevated with folded towel under leg to reduce heel drag on floor when initiating movement    Marching Both;10 reps;AROM;AAROM   Marching Limitations AAROM on R   Abduction/Adduction  Both;10 reps   Abd/Adduction Limitations knee flexed with feet on floor     Knee/Hip Exercises: Supine   Quad Sets Both;10 reps   Quad Sets Limitations  5" hold   Short Arc [employer] Both;10 reps   Short Arc [employer] Limitations 3-5" hold   Bridges Both;5 reps;2 [employer] Limitations PT stabilizing knees   Other Supine Knee/Hip Exercises B Glute & HS sets 10x5" each   Other Supine Knee/Hip Exercises B unilateral hip ABD/ER with yellow TB 10x3"                  PT Short Term Goals - 01/15/17 1052      PT SHORT TERM GOAL #1   Title Independent with initial HEP for balance and strengthening exercises by 02/02/17   Status On-going     PT SHORT TERM GOAL #2   Title Demo gait velocity =/> 1.31 ft/sec with RW and supervision by 02/02/17   Status On-going     PT SHORT TERM GOAL #3    Title Demo dynamic sitting balance to allow consistent reaching out of his base of support and no need for upper body assist/no LOB by 02/02/17   Status On-going           PT Long Term Goals - 01/10/17 1031      PT LONG TERM GOAL #1   Title Independent with advanced HEP by 03/02/17   Status On-going     PT LONG TERM GOAL #2   Title Improve TUG score to </= 25 secs with RW by 03/02/17    Status On-going     PT LONG TERM GOAL #3   Title Demo LE strength =/> 3+/5 throughout B LEs by 03/02/17   Status On-going     PT LONG TERM GOAL #4   Title Demo gait velocity =/> 1.8 ft/sec with RW and consistent R foot clearance to reduce risk for falls by 02/02/17   Status On-going     PT LONG TERM GOAL #5   Title Report =/> 50% reduction in falls by 03/02/17   Status On-going               Plan - 01/15/17 1053    Clinical Impression Statement Pt reporting good compliance with HEP and denies anu issues at home. Able to demonstrate all exercises appropriately with slight modification (elevating R thigh) for R heel slides and LAQ to reduce foot drag on floor. Pt able to tolerate slight progression of some exercises as well as addition of a few new exercises today with only mild fatigue noted.   Rehab Potential Good   Clinical Impairments Affecting Rehab Potential HOH with cochlear implant, B numbness in feet, R foot drop with new AFO, recurrent falls with h/o injuries related to falls   PT Treatment/Interventions Patient/family education;Neuromuscular re-education;Therapeutic exercise;Therapeutic activities;Functional mobility training;Gait training;Stair training;Manual techniques;Taping;Cryotherapy;Moist Heat;ADLs/Self Care Home Management   PT Next Visit Plan Progress LE strengthening as appropriate; unsupported sitting balance; transfer training; gait training   Consulted and Agree with Plan of Care Patient      Patient will benefit from skilled therapeutic intervention in order to  improve the following deficits and impairments:  Abnormal gait, Difficulty walking, Decreased strength, Decreased activity tolerance, Decreased balance, Decreased coordination, Decreased mobility, Decreased range of motion, Decreased safety awareness, Impaired flexibility, Postural dysfunction, Pain  Visit Diagnosis: Abnormality of gait  Difficulty in walking, not elsewhere classified  Other lack of coordination  Muscle weakness (generalized)     Problem List There are no active problems to display for this patient.   Percival Spanish, PT, MPT 01/15/2017, 11:09 AM  [facility] [address]  [place], [place], [address] Phone: [phone]   Fax:  [phone]  Name: JARIUS DIEUDONNE MRN: [id number] Date of Birth: May 26, 1976    PHYSICAL THERAPY DISCHARGE SUMMARY  Visits from Start of Care: 3  Current functional level related to goals / functional outcomes:   Unable to assess as pt only returned for 2 visits following the eval before requesting to be placed on hold for 30 days due to financial concerns (pt was expecting to have several MRI's and was hoping to meet his deductible to reduce therapy copay costs). No further visits scheduled in >30 days, therefore will proceed with discharge from PT.   Remaining deficits:   As above.   Education / Equipment:    HEP  G-Codes - 02/13/2017    Functional Assessment Tool Used (Outpatient Only) Neuromuscular disorder - 34% (66% limitation) + clinical judgement   Functional Limitation Mobility: Walking and moving around   Mobility: Walking and Moving Around Goal Status [phone]) At least 60 percent but less than 80 percent impaired, limited or restricted   Mobility: Walking and Moving Around Discharge Status [phone]) At least 80 percent but less than 100 percent impaired, limited or restricted    Plan: Patient agrees to discharge.  Patient goals were not met. Patient is  being discharged due to not returning since the last visit.  ?????     Percival Spanish, PT, MPT 03/01/17, 2:43 PM  [facility] [address]  [place], [place], [address] Phone: [phone]   Fax:  [phone]

## 2017-01-15 NOTE — Therapy (Signed)
[facility] [address]  [place], [place], [address] Phone: [phone]   Fax:  [phone]  Occupational Therapy Treatment  Patient Details  Name: Derek Day MRN: [id number] Date of Birth: Dec 08, 1975 Referring Provider: Dr. Tana Coast  Encounter Date: 01/15/2017      OT End of Session - 01/15/17 1020    Visit Number 3   Number of Visits 13   Date for OT Re-Evaluation 02/21/17   Authorization Type [employer] MCR - G code needed   Authorization - Visit Number 3   Authorization - Number of Visits 10   OT Start Time 0930   OT Stop Time 1015   OT Time Calculation (min) 45 min   Activity Tolerance Patient tolerated treatment well      Past Medical History:  Diagnosis Date  . Brain tumor ([place])   . Spine disorder     No past surgical history on file.  There were no vitals filed for this visit.      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 No/denies                      OT Treatments/Exercises (OP) - 01/15/17 0001      Exercises   Exercises --  Reviewed ulnar n. gliding HEP      Hand Exercises   Other Hand Exercises Verbally reviewed putty HEP    Other Hand Exercises Pt issued finger abd/add ex. (see pt instructions). Gripper set at 35 lbs resistance to pick up blocks Lt hand for sustained grip strength     Fine Motor Coordination   Fine Motor Coordination Small Pegboard   Small Pegboard Pt placing small pegs in pegboard Lt hand to copy peg design manipulating up to 5 pegs at a time for in hand manipulation with min difficulty and drops. Pt required extra time,  and min cues to copy and/or correct design    Other Fine Motor Exercises Reviewed previously issued coordination HEP                OT Education - 01/15/17 0950    Education provided Yes   Education Details finger abd/add ex   Person(s) Educated Patient   Methods Explanation;Demonstration;Handout   Comprehension Verbalized understanding;Returned demonstration          OT Short Term Goals - 01/15/17 1021      OT SHORT TERM GOAL #1   Title Independent with HEP for coordination and hand strength LUE   Time 3   Period Weeks   Status Achieved     OT SHORT TERM GOAL #2   Title Improve coordination Lt hand as evidenced by performing 9 hole peg test in 35 sec. or less   Baseline eval: 41.69 sec   Time 3   Period Weeks   Status On-going     OT SHORT TERM GOAL #3   Title Improve Lt grip strength to 55 lbs or greater to assist with opening jars/containters   Baseline eval = 48 lbs (Rt = 107 lbs)    Time 3   Period Weeks   Status On-going  OT SHORT TERM GOAL #4   Title Pt to verbalize understanding with safety recommendations for ulnar n. deficits and HEP for ulnar nerve gliding and intrinsic movement of hand   Time 3   Period Weeks   Status On-going           OT Long Term Goals - 01/03/17 1508      OT LONG TERM GOAL #1   Title Pt independent with updated HEP to maintain bilateral sh. strength and integrity   Time 6   Period Weeks   Status New     OT LONG TERM GOAL #2   Title Pt to verbalize understanding with necessary DME and modifications/compensations to increase ease and safety with ADLS (especially when standing to don pants)    Time 6   Period Weeks   Status New     OT LONG TERM GOAL #3   Title Improve Lt grip strength to 60 lbs or greater   Baseline eval = 48 lbs   Time 6   Period Weeks   Status New               Plan - 01/15/17 1021    Clinical Impression Statement Pt met STG #1 and making progress towards other STG's.     Clinical Impairments Affecting Rehab Potential Severity of deficits, active CA   OT Frequency 2x / week   OT Duration 6 weeks   OT Treatment/Interventions Self-care/ADL training;Moist Heat;DME and/or AE instruction;Splinting;Patient/family education;Therapeutic activities;Therapeutic exercises;Neuromuscular education;Functional Mobility Training;Passive range of motion;Parrafin;Manual Therapy;Ultrasound   Plan theraband HEP for BUE's, standing balance - work on simulating donning/doffing pants for clothes management during toileting   Consulted and Agree with Plan of Care Patient      Patient will benefit from skilled therapeutic intervention in order to improve the following deficits and impairments:  Decreased coordination, Decreased endurance, Impaired sensation, Impaired tone, Decreased activity tolerance, Improper body mechanics, Decreased balance, Decreased knowledge of use of DME, Impaired UE functional use, Decreased mobility, Decreased strength  Visit Diagnosis: Other lack of coordination  Muscle weakness (generalized)    Problem List There are no active problems to display for this patient.   Carey Bullocks, OTR/L 01/15/2017, 10:23 AM  [facility] [address]  [place], [place], [address] Phone: [phone]   Fax:  [phone]  Name: Derek Day MRN: [id number] Date of Birth: July 03, 1976

## 2017-01-24 ENCOUNTER — Ambulatory Visit: Payer: Medicare HMO | Admitting: Physical Therapy

## 2017-01-24 ENCOUNTER — Ambulatory Visit: Payer: Medicare HMO | Admitting: Occupational Therapy

## 2017-01-27 ENCOUNTER — Ambulatory Visit (HOSPITAL_BASED_OUTPATIENT_CLINIC_OR_DEPARTMENT_OTHER)
Admission: RE | Admit: 2017-01-27 | Discharge: 2017-01-27 | Disposition: A | Discharge status: Home / Self Care | Payer: Medicare HMO | Source: Ambulatory Visit | Attending: Oncology | Admitting: Oncology

## 2017-01-27 DIAGNOSIS — C711 Malignant neoplasm of frontal lobe: Secondary | ICD-10-CM | POA: Insufficient documentation

## 2017-01-27 DIAGNOSIS — Z981 Arthrodesis status: Secondary | ICD-10-CM | POA: Insufficient documentation

## 2017-01-27 DIAGNOSIS — G959 Disease of spinal cord, unspecified: Secondary | ICD-10-CM | POA: Insufficient documentation

## 2017-01-27 MED ORDER — GADOBENATE DIMEGLUMINE 529 MG/ML IV SOLN: 13.0000 mL | Freq: Once | INTRAVENOUS | Status: DC | PRN

## 2017-01-31 ENCOUNTER — Ambulatory Visit: Payer: Medicare HMO | Admitting: Physical Therapy

## 2017-01-31 ENCOUNTER — Ambulatory Visit: Payer: Medicare HMO | Admitting: Occupational Therapy

## 2017-02-07 ENCOUNTER — Ambulatory Visit: Payer: Medicare HMO | Admitting: Physical Therapy

## 2017-02-07 ENCOUNTER — Ambulatory Visit: Payer: Medicare HMO | Admitting: Occupational Therapy

## 2017-03-07 ENCOUNTER — Encounter: Payer: Self-pay | Admitting: Occupational Therapy

## 2017-03-07 NOTE — Therapy (Signed)
[facility] [address]  [place], [place], [address] Phone: [phone]   Fax:  [phone]  Patient Details  Name: Derek Day MRN: [id number] Date of Birth: Jun 12, 1976 Referring Provider:  Dr. Tana Coast Encounter Date: 03/07/2017  Pt has not returned in greater than 30 days (last seen on 01/15/17), therefore will d/c episode of care at this time. Pt only met STG #1 due to only being seen 3 visits. Pt was initially placed on hold d/t financial reasons - waiting for deductible to be met.  Carey Bullocks, OTR/L 03/07/2017, 12:18 PM  [facility] [address]  [place], [place], [address] Phone: [phone]   Fax:  [phone]

## 2017-08-19 ENCOUNTER — Emergency Department
Admission: EM | Admit: 2017-08-19 | Discharge: 2017-08-19 | Disposition: A | Discharge status: Home / Self Care | Payer: Medicare HMO | Source: Home / Self Care | Attending: Family Medicine | Admitting: Family Medicine

## 2017-08-19 ENCOUNTER — Encounter: Payer: Self-pay | Admitting: Emergency Medicine

## 2017-08-19 DIAGNOSIS — L03113 Cellulitis of right upper limb: Secondary | ICD-10-CM

## 2017-08-19 MED ORDER — CLINDAMYCIN HCL 300 MG PO CAPS: 300.0000 mg | ORAL_CAPSULE | Freq: Four times a day (QID) | ORAL | 0 refills | Status: DC

## 2017-08-19 MED ORDER — CEFTRIAXONE SODIUM 1 G IJ SOLR
1.0000 g | Freq: Once | INTRAMUSCULAR | Status: AC
  Administered 2017-08-19: 1 g via INTRAMUSCULAR

## 2017-08-19 NOTE — ED Provider Notes (Signed)
[name]    CSN: [id number] Arrival date & time: 08/19/17  1716     History   Chief Complaint Chief Complaint  Patient presents with  . Arm Swelling    HPI Derek Day is a 41 y.o. male.   HPI  Derek Day is a 41 y.o. male presenting to UC with c/o 2-3 days of gradually worsening Right arm redness, swelling, warmth, and pain that started after getting an abrasion on his elbow.  Pt is wheelchair bound due to brain tumor and spine disorder and states he often pushes up using his elbows but that results in a recurrent abrasion to his Right elbow.  Pt concerned about the swelling and warmth surrounding the abrasion.  Denies fever, chills, n/v/d.   He was on bactrim recently for a UTI but is not currently on any antibiotics.    Past Medical History:  Diagnosis Date  . Brain tumor ([place])   . Spine disorder     There are no active problems to display for this patient.   History reviewed. No pertinent surgical history.     Home Medications    Prior to Admission medications   Medication Sig Start Date End Date Taking? Authorizing Provider  dexamethasone (DECADRON) 2 MG tablet Take 2 mg by mouth 2 (two) times daily with a meal.   Yes [provider]  clindamycin (CLEOCIN) 300 MG capsule Take 1 capsule (300 mg total) by mouth 4 (four) times daily. X 7 days 08/19/17   Noe Gens, PA-C  diazepam (VALIUM) 5 MG tablet Take 5 mg by mouth every 6 (six) hours as needed for muscle spasms.    [provider]  ondansetron (ZOFRAN-ODT) 4 MG disintegrating tablet Take 4 mg by mouth every 8 (eight) hours as needed for nausea or vomiting.    [provider]    Family History No family history on file.  Social History Social History  Substance Use Topics  . Smoking status: Former Smoker    Packs/day: 0.50    Years: 2.00    Types: Cigarettes  . Smokeless tobacco: Never Used  . Alcohol use Yes     Allergies   Patient has no  known allergies.   Review of Systems Review of Systems  Constitutional: Negative for chills and fever.  Gastrointestinal: Negative for diarrhea, nausea and vomiting.  Musculoskeletal: Positive for arthralgias, joint swelling and myalgias.  Skin: Positive for color change and wound.     Physical Exam Triage Vital Signs ED Triage Vitals [08/19/17 1748]  Enc Vitals Group     BP (!) 147/92     Pulse Rate 88     Resp      Temp 98.3 F (36.8 C)     Temp Source Oral     SpO2 98 %     Weight      Height      Head Circumference      Peak Flow      Pain Score 4     Pain Loc      Pain Edu?      Excl. in [place]?    No data found.   Updated Vital Signs BP (!) 147/92 (BP Location: Left Arm)   Pulse 88   Temp 98.3 F (36.8 C) (Oral)   SpO2 98%   Visual Acuity Right Eye Distance:   Left Eye Distance:   Bilateral Distance:    Right Eye Near:   Left Eye Near:  Bilateral Near:     Physical Exam  Constitutional: He is oriented to person, place, and time. He appears well-developed and well-nourished. No distress.  HENT:  Head: Normocephalic and atraumatic.  Eyes: EOM are normal.  Neck: Normal range of motion.  Cardiovascular: Normal rate.   Pulmonary/Chest: Effort normal.  Musculoskeletal: Normal range of motion. He exhibits edema and tenderness.       Arms: Right arm: no bony tenderness. Tenderness to muscles of proximal forearm, mild edema. Full ROM at elbow and wrist.   Neurological: He is alert and oriented to person, place, and time.  Skin: Skin is warm and dry. He is not diaphoretic. There is erythema.  Right proximal forearm: scab over olecranon process. 4x5cm area of erythema and warmth over proximal forearm. Tender. No fluctuance.   Psychiatric: He has a normal mood and affect. His behavior is normal.  Nursing note and vitals reviewed.    UC Treatments / Results  Labs (all labs ordered are listed, but only abnormal results are displayed) Labs Reviewed - No  data to display  EKG  EKG Interpretation None       Radiology No results found.  Procedures Procedures (including critical care time)  Medications Ordered in UC Medications  cefTRIAXone (ROCEPHIN) injection 1 g (1 g Intramuscular Given 08/19/17 1814)     Initial Impression / Assessment and Plan / UC Course  I have reviewed the triage vital signs and the nursing notes.  Pertinent labs & imaging results that were available during my care of the patient were reviewed by me and considered in my medical decision making (see chart for details).     Hx and exam c/w cellulitis. Will start pt on clindamycin. Encouraged f/u with PCP in 3-4 days if not improving, sooner if significantly worsening.   Final Clinical Impressions(s) / UC Diagnoses   Final diagnoses:  Cellulitis of right forearm    New Prescriptions Discharge Medication List as of 08/19/2017  6:06 PM    START taking these medications   Details  clindamycin (CLEOCIN) 300 MG capsule Take 1 capsule (300 mg total) by mouth 4 (four) times daily. X 7 days, Starting Sun 08/19/2017, Print         Controlled Substance Prescriptions [place] Controlled Substance Registry consulted? Not Applicable   Tyrell Antonio 08/20/17 0092

## 2017-08-19 NOTE — ED Triage Notes (Signed)
Patient complaining of right arm swelling, redness, tight feeling, hot to touch.

## 2017-08-23 ENCOUNTER — Telehealth: Payer: Self-pay

## 2017-08-23 NOTE — Telephone Encounter (Signed)
Spoke with Pt, saw oncologist Monday.  Doing much better.  Will follow up as needed.

## 2017-09-12 ENCOUNTER — Emergency Department
Admission: EM | Admit: 2017-09-12 | Discharge: 2017-09-12 | Disposition: A | Discharge status: Home / Self Care | Payer: Medicare HMO | Source: Home / Self Care | Attending: Emergency Medicine | Admitting: Emergency Medicine

## 2017-09-12 ENCOUNTER — Encounter: Payer: Self-pay | Admitting: Emergency Medicine

## 2017-09-12 DIAGNOSIS — L03113 Cellulitis of right upper limb: Secondary | ICD-10-CM

## 2017-09-12 LAB — POCT CBC W AUTO DIFF (K'VILLE URGENT CARE)

## 2017-09-12 NOTE — Discharge Instructions (Signed)
Please go to the emergency room at [facility] for evaluation

## 2017-09-12 NOTE — ED Provider Notes (Addendum)
[name]    CSN: [id number] Arrival date & time: 09/12/17  1715     History   Chief Complaint Chief Complaint  Patient presents with  . Elbow Pain    HPI Derek Day is a 41 y.o. male.  Patient enters with pain and discomfort in his right elbow. He was seen on 10/07/2018and treated with clindamycin for a cellulitis. He subsequently was seen by his oncologist the following Monday and given an infusion of IV antibiotics. He felt this though he was doing okay and recently has developed increasing pain and swelling of his right elbow forearm and lower right arm and inability to flex extend at the right elbow joint. He had his chemotherapy treatment on Monday. He has a history of metastatic ependymoma. He is status post multiple surgeries. The primary provider for him is the [facility]. HPI  Past Medical History:  Diagnosis Date  . Brain tumor ([place])   . Spine disorder     There are no active problems to display for this patient.   Past Surgical History:  Procedure Laterality Date  . BRAIN SURGERY    . SPINE SURGERY         Home Medications    Prior to Admission medications   Medication Sig Start Date End Date Taking? Authorizing Provider  diazepam (VALIUM) 5 MG tablet Take 5 mg by mouth every 6 (six) hours as needed for muscle spasms.    [provider]  ondansetron (ZOFRAN-ODT) 4 MG disintegrating tablet Take 4 mg by mouth every 8 (eight) hours as needed for nausea or vomiting.    [provider]    Family History No family history on file.  Social History Social History  Substance Use Topics  . Smoking status: Former Smoker    Packs/day: 0.50    Years: 2.00    Types: Cigarettes  . Smokeless tobacco: Never Used  . Alcohol use Yes     Allergies   Patient has no known allergies.   Review of Systems Review of Systems  Musculoskeletal: Positive for arthralgias and myalgias.  Skin: Positive for color  change.     Physical Exam Triage Vital Signs ED Triage Vitals  Enc Vitals Group     BP 09/12/17 1737 135/80     Pulse Rate 09/12/17 1737 (!) 120     Resp --      Temp 09/12/17 1737 99.7 F (37.6 C)     Temp Source 09/12/17 1737 Oral     SpO2 09/12/17 1737 98 %     Weight 09/12/17 1738 150 lb (68 kg)     Height --      Head Circumference --      Peak Flow --      Pain Score 09/12/17 1738 9     Pain Loc --      Pain Edu? --      Excl. in [place]? --    No data found.   Updated Vital Signs BP 135/80 (BP Location: Left Arm)   Pulse (!) 120   Temp 99.7 F (37.6 C) (Oral)   Wt 150 lb (68 kg)   SpO2 98%   BMI 20.34 kg/m   Visual Acuity Right Eye Distance:   Left Eye Distance:   Bilateral Distance:    Right Eye Near:   Left Eye Near:    Bilateral Near:     Physical Exam  Constitutional:  There are multiple scars on his 4.There is  a metallic prosthetic posterior to the left mastoid. His neck is supple.  Musculoskeletal:  There is no swelling over the right humerus. Midway down the right upper arm there begins to the redness with induration which extends over the elbow joint. This area is warm to touch. There is also discomfort over the proximal forearm with redness and swelling and induration. Patient is able to flex to 90 but past this has significant pain. He is unable to achieve full extension.     UC Treatments / Results  Labs (all labs ordered are listed, but only abnormal results are displayed) Labs Reviewed  POCT CBC W AUTO DIFF ([place])    EKG  EKG Interpretation None       Radiology No results found.  Procedures Procedures (including critical care time)  Medications Ordered in UC Medications - No data to display   Initial Impression / Assessment and Plan / UC Course  I have reviewed the triage vital signs and the nursing notes.  Pertinent labs & imaging results that were available during my care of the patient were reviewed by  me and considered in my medical decision making (see chart for details). This is a high risk patient with history of metastatic ependymoma. He was treated for a cellulitis of the right elbow 3 weeks ago. He enters today with return of the symptoms. He has significant  .redness, swelling, and induration around the right elbow with limited range of motion of the elbow raising the question of septic arthritis. He certainly has an underlying severe cellulitis. Patient is advised to transport to the emergency room at [facility] for evaluation and management.9999ID: Triage nurse in the emergency room.      Final Clinical Impressions(s) / UC Diagnoses   Final diagnoses:  Cellulitis of arm, right    New Prescriptions New Prescriptions   No medications on file     Controlled Substance Prescriptions [place] Controlled Substance Registry consulted? Not Applicable   Darlyne Russian, MD 09/12/17 1839    Darlyne Russian, MD 09/12/17 4403    Darlyne Russian, MD 09/12/17 [phone]

## 2017-09-12 NOTE — ED Triage Notes (Signed)
Right elbow pain x 3 days, was seen 3 weeks ago, it got better, but bothering him again

## 2017-11-01 IMAGING — MR MR HEAD WO/W CM
11 of 13 series · 38 of 48 positions shown · IV contrast (13ML MULTIHANCE)
Comparison: Brain MRI 03/17/2014

CLINICAL DATA: History of ependymoma. Hearing loss and difficulty
walking. Bilateral upper extremity numbness and weakness.

EXAM:
MRI HEAD WITHOUT AND WITH CONTRAST
TECHNIQUE: Multiplanar, multiecho pulse sequences of the brain and surrounding
structures were obtained without and with intravenous contrast.
CONTRAST:  13 mL MultiHance IV

[Series 2: T1 · sagittal · 5.0mm · 0.47mm/px · 1 of 24 slices shown]
[im 1/24]
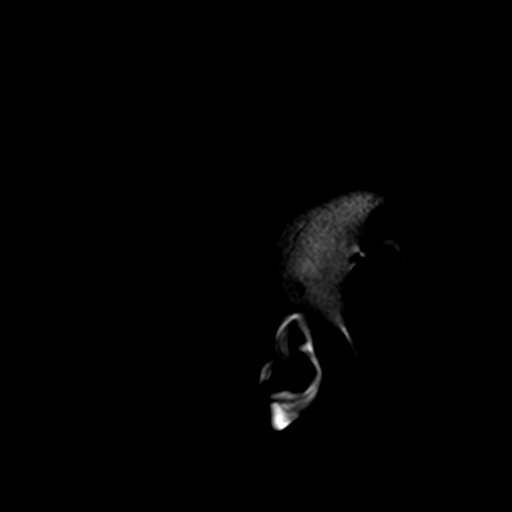

[Series 3: DWI · axial · 3.0mm · 2.19mm/px · z∈[-108,+63]mm · 8 of 106 slices shown (1 of 4)]
[im 1/106]
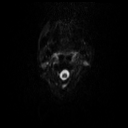
[im 16/106]
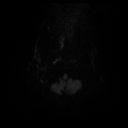
[im 31/106]
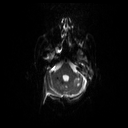
[im 46/106]
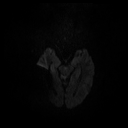
[im 61/106]
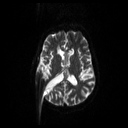
[im 76/106]
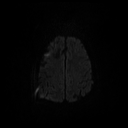
[im 91/106]
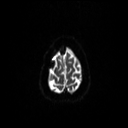
[im 106/106]
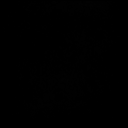

[Series 4: DWI · axial · 3.0mm · 2.19mm/px · z∈[-108,+60]mm · 4 of 51 slices shown (2 of 4)]
[im 1/51]
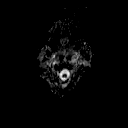
[im 17/51]
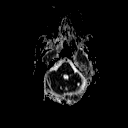
[im 34/51]
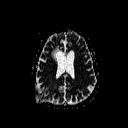
[im 51/51]
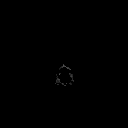

[Series 5: DWI · coronal · 3.0mm · 1.46mm/px · 8 of 110 slices shown (3 of 4)]
[im 1/110]
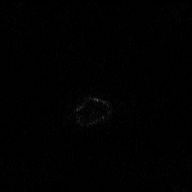
[im 16/110]
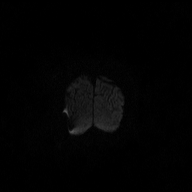
[im 32/110]
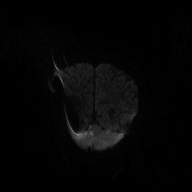
[im 47/110]
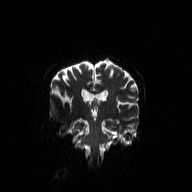
[im 63/110]
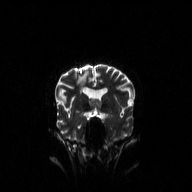
[im 78/110]
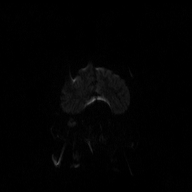
[im 94/110]
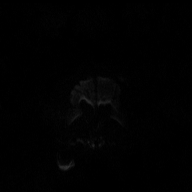
[im 110/110]
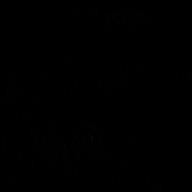

[Series 6: DWI · coronal · 3.0mm · 1.46mm/px · 4 of 55 slices shown (4 of 4)]
[im 1/55]
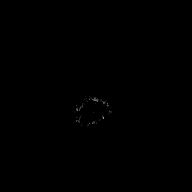
[im 19/55]
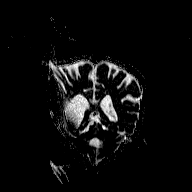
[im 37/55]
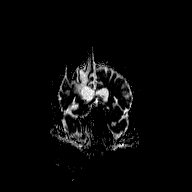
[im 55/55]
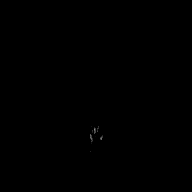

[Series 7: T2 · axial · 5.0mm · 0.94mm/px · z∈[-102,+63]mm · 2 of 25 slices shown (1 of 2)]
[im 1/25]
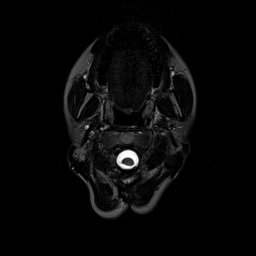
[im 25/25]
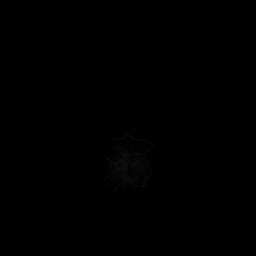

[Series 8: T2 · axial · 5.0mm · 0.45mm/px · z∈[-103,+62]mm · 2 of 25 slices shown (2 of 2)]
[im 1/25]
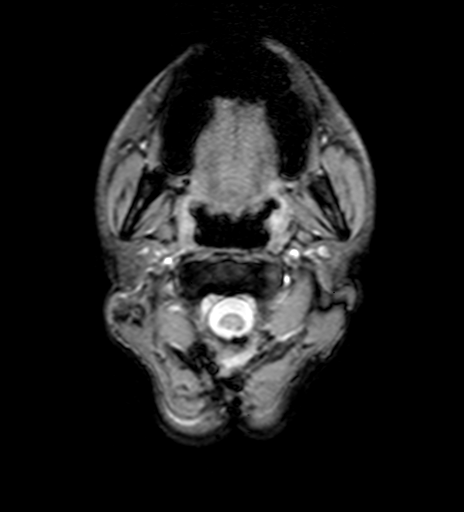
[im 25/25]
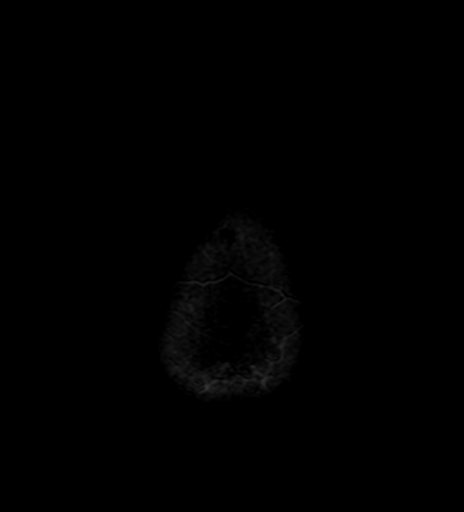

[Series 9: FLAIR · axial · 3.0mm · 0.45mm/px · z∈[-101,+60]mm · 3 of 40 slices shown]
[im 1/40]
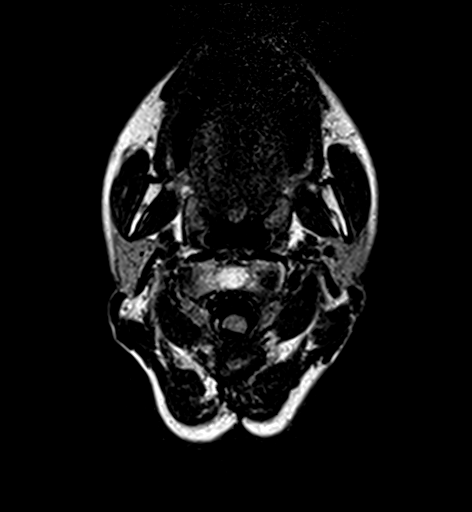
[im 20/40]
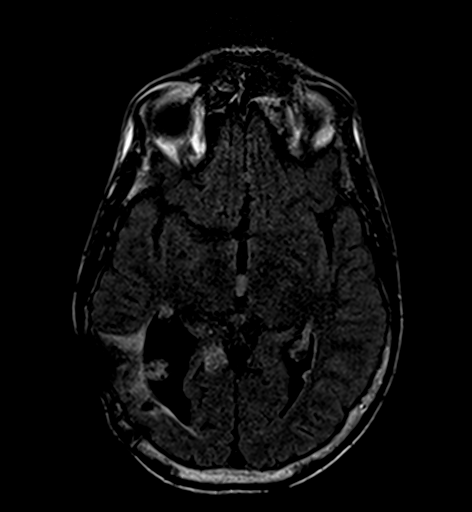
[im 40/40]
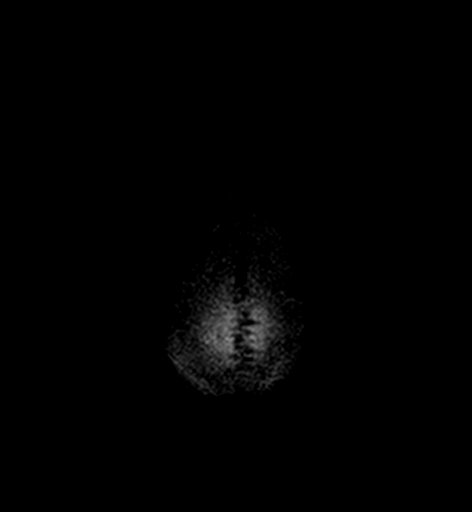

[Series 11: T2 post-contrast · coronal · 5.0mm · 0.45mm/px · 2 of 28 slices shown]
[im 1/28]
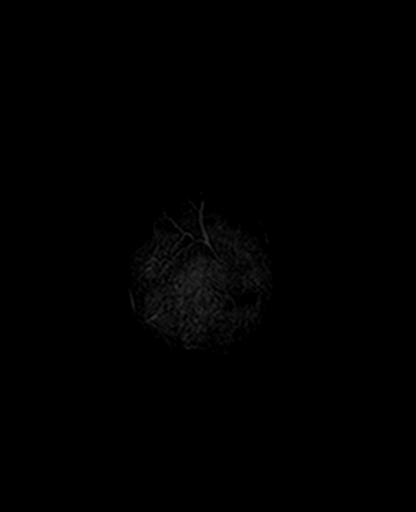
[im 28/28]
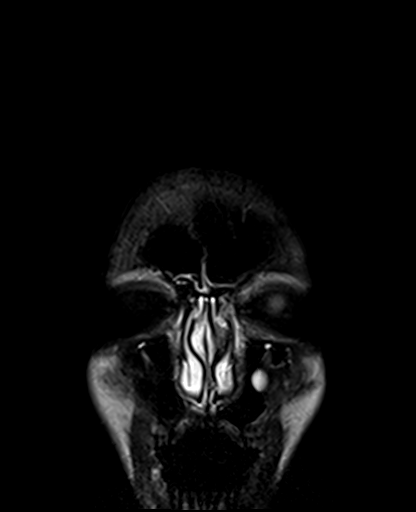

[Series 13: T1 post-contrast · coronal · 5.0mm · 0.45mm/px · 2 of 28 slices shown (1 of 2)]
[im 1/28]
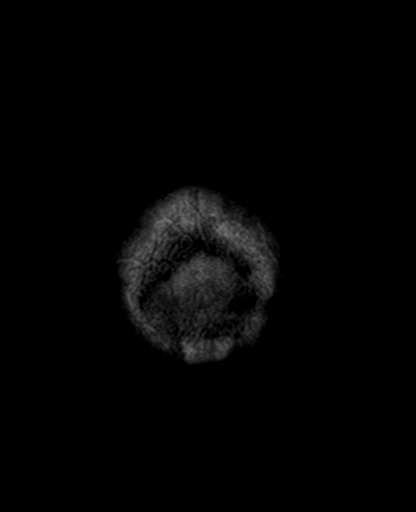
[im 28/28]
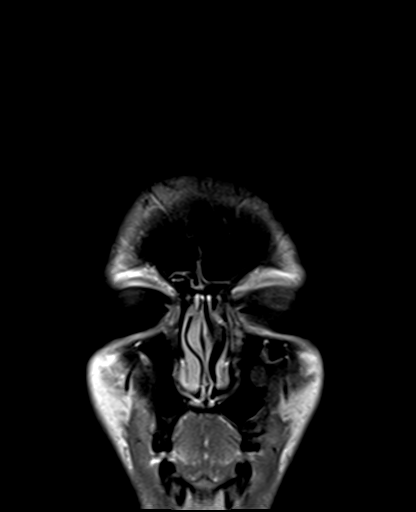

[Series 14: T1 post-contrast · sagittal · 5.0mm · 0.47mm/px · 2 of 24 slices shown (2 of 2)]
[im 1/24]
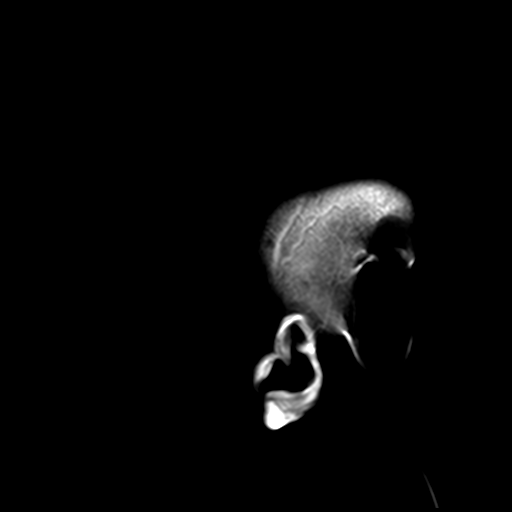
[im 24/24]
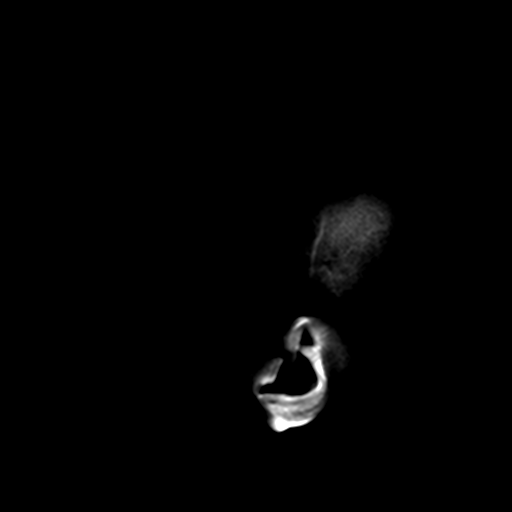

[38 of 48 positions shown; findings below may reference images not displayed]

FINDINGS: Brain: There is no evidence of acute infarct or intracranial
hemorrhage. There has been marked worsening of widespread addendum
lesions. Most notably, confluent lesions along the floor of the
lateral ventricles have greatly worsened, now measuring altogether
approximately 5.9 x 1.9 cm. Lesions within the third ventricle have
also increased in size, with the more anterior lesion measuring 10 x
9 mm. The lesion in the atrium of the right lateral ventricle has
increased in size, now measuring 12 x 9 mm. The cerebral aqueduct is
now filled with tumor, which is worsened from the prior study. The
size and number of lesions within the cerebellum are also increased.

There is a right parietal approach shunt catheter. Size and
configuration of the ventricles are unchanged. There are
postsurgical changes of the right frontal lobe and right parietal
lobe that are stable compared to the prior examination. The degree
of periventricular hyperintense T2 weighted signal adjacent to the
lesion in the right lateral ventricle atrium has worsened.
Hyperintense T2 weighted signal in the right frontal lobe is
unchanged.

Vascular: Major intracranial arterial and venous sinus flow voids
are preserved.

Skull and upper cervical spine: The visualized skull base,
calvarium, upper cervical spine and extracranial soft tissues are
normal.

Sinuses/Orbits: There is left mastoid effusion. There is partial
opacification of the posterior right ethmoid air cells and right
sphenoid sinus. There is a fluid level in the right sphenoid sinus.
Normal orbits.
IMPRESSION: 1. Marked worsening of widespread intracranial tumor burden, most
notably within the midline ventricular system extending from the
floor of the lateral ventricles into the third ventricle and
cerebral aqueduct. Superior cerebellar disease burden is also
greatly increased.
2. Unchanged size and configuration of the shunted ventricles.

## 2018-05-26 ENCOUNTER — Other Ambulatory Visit: Payer: Self-pay

## 2018-05-26 ENCOUNTER — Emergency Department
Admission: EM | Admit: 2018-05-26 | Discharge: 2018-05-26 | Disposition: A | Discharge status: Home / Self Care | Payer: Medicare HMO | Source: Home / Self Care

## 2018-05-26 ENCOUNTER — Encounter: Payer: Self-pay | Admitting: Emergency Medicine

## 2018-05-26 DIAGNOSIS — B029 Zoster without complications: Secondary | ICD-10-CM | POA: Diagnosis not present

## 2018-05-26 MED ORDER — VALACYCLOVIR HCL 1 G PO TABS: ORAL_TABLET | ORAL | 0 refills | Status: AC

## 2018-05-26 NOTE — Discharge Instructions (Signed)
Call your Physician on Monday and let them know you have shingles

## 2018-05-26 NOTE — ED Triage Notes (Signed)
Patient reports unusual sensation along left rib border for past couple days; last night noticed obvious blistering rash that is sore rather than itching.

## 2018-05-27 NOTE — ED Provider Notes (Signed)
[facility]    CSN: [id number] Arrival date & time: 05/26/18  1148     History   Chief Complaint Chief Complaint  Patient presents with  . Rash    HPI Derek Day is a 42 y.o. male.   The history is provided by the patient. No language interpreter was used.  Rash  Location:  Torso Torso rash location:  Upper back and abd LUQ Quality: blistering and redness   Severity:  Severe Onset quality:  Gradual Timing:  Constant Progression:  Worsening Chronicity:  New Relieved by:  Nothing Ineffective treatments:  None tried Associated symptoms: no nausea   Pt reports he has a rash on abdomen and back.  Rash is itchy and painful  Past Medical History:  Diagnosis Date  . Brain tumor (Derek [facility])   . Spine disorder     There are no active problems to display for this patient.   Past Surgical History:  Procedure Laterality Date  . BRAIN SURGERY    . SPINE SURGERY         Home Medications    Prior to Admission medications   Medication Sig Start Date End Date Taking? Authorizing Provider  diphenhydrAMINE (BENADRYL) 25 MG tablet Take 25 mg by mouth every 6 (six) hours as needed.   Yes [provider]  HYDROmorphone (DILAUDID) 2 MG tablet Take by mouth every 4 (four) hours as needed for severe pain.   Yes [provider]  lapatinib (TYKERB) 250 MG tablet Take 750 mg by mouth daily. Take on an empty stomach, at least 1 hour before or 1 hour after meals.   Yes [provider]  temozolomide (TEMODAR) 180 MG capsule Take 160 mg by mouth daily. May take on an empty stomach or at bedtime to decrease nausea & vomiting.   Yes [provider]  diazepam (VALIUM) 5 MG tablet Take 5 mg by mouth every 6 (six) hours as needed for muscle spasms.    [provider]  ondansetron (ZOFRAN-ODT) 4 MG disintegrating tablet Take 4 mg by mouth every 8 (eight) hours as needed for nausea or vomiting.    [provider]  valACYclovir  (VALTREX) 1000 MG tablet One po tid 05/26/18   Fransico Meadow, PA-C    Family History No family history on file.  Social History Social History   Tobacco Use  . Smoking status: Former Smoker    Packs/day: 0.50    Years: 2.00    Pack years: 1.00    Types: Cigarettes  . Smokeless tobacco: Never Used  Substance Use Topics  . Alcohol use: Yes  . Drug use: No     Allergies   Patient has no known allergies.   Review of Systems Review of Systems  Gastrointestinal: Negative for nausea.  Skin: Positive for rash.  All other systems reviewed and are negative.    Physical Exam Triage Vital Signs ED Triage Vitals  Enc Vitals Group     BP 05/26/18 1245 110/75     Pulse Rate 05/26/18 1245 82     Resp 05/26/18 1245 16     Temp 05/26/18 1245 97.6 F (36.4 C)     Temp Source 05/26/18 1245 Oral     SpO2 05/26/18 1245 100 %     Weight 05/26/18 1251 150 lb (68 kg)     Height 05/26/18 1251 6' (1.829 m)     Head Circumference --      Peak Flow --  Pain Score 05/26/18 1250 1     Pain Loc --      Pain Edu? --      Excl. in [place]? --    No data found.  Updated Vital Signs BP 110/75 (BP Location: Right Arm)   Pulse 82   Temp 97.6 F (36.4 C) (Oral)   Resp 16   Ht 6' (1.829 m)   Wt 150 lb (68 kg)   SpO2 100%   BMI 20.34 kg/m   Visual Acuity Right Eye Distance:   Left Eye Distance:   Bilateral Distance:    Right Eye Near:   Left Eye Near:    Bilateral Near:     Physical Exam  Constitutional: He appears well-developed and well-nourished.  HENT:  Head: Normocephalic.  Eyes: Pupils are equal, round, and reactive to light.  Cardiovascular: Normal rate.  Pulmonary/Chest: Effort normal.  Skin: Rash noted. There is erythema.  Blisters chest and abdomen on left side of chest and back,  Rash follows dermatonal pattern.  Nursing note and vitals reviewed.    UC Treatments / Results  Labs (all labs ordered are listed, but only abnormal results are displayed) Labs  Reviewed - No data to display  EKG None  Radiology No results found.  Procedures Procedures (including critical care time)  Medications Ordered in UC Medications - No data to display  Initial Impression / Assessment and Plan / UC Course  I have reviewed the triage vital signs and the nursing notes.  Pertinent labs & imaging results that were available during my care of the patient were reviewed by me and considered in my medical decision making (see chart for details).    MMD Pt counseled on shingles and ned for folowup  Final Clinical Impressions(s) / UC Diagnoses   Final diagnoses:  Herpes zoster without complication     Discharge Instructions     Call your Physician on Monday and let them know you have shingles   ED Prescriptions    Medication Sig Dispense Auth. Provider   valACYclovir (VALTREX) 1000 MG tablet One po tid 30 tablet Fransico Meadow, [place]     Controlled Substance Prescriptions [place] Controlled Substance Registry consulted? Not Applicable  An After Visit Summary was printed and given to the patient.    Fransico Meadow, [place] 05/27/18 [phone]

## 2019-11-14 DEATH — deceased
